# Patient Record
Sex: Female | Born: 1980 | Race: White | Hispanic: No | Marital: Single | Smoking: Current every day smoker
Health system: Southern US, Community
[De-identification: ages and names within clinical notes are randomized; demographics above are authoritative.]

## PROBLEM LIST (undated history)

## (undated) DIAGNOSIS — F1910 Other psychoactive substance abuse, uncomplicated: Secondary | ICD-10-CM

## (undated) DIAGNOSIS — F419 Anxiety disorder, unspecified: Secondary | ICD-10-CM

## (undated) DIAGNOSIS — F909 Attention-deficit hyperactivity disorder, unspecified type: Secondary | ICD-10-CM

## (undated) DIAGNOSIS — Z789 Other specified health status: Secondary | ICD-10-CM

## (undated) HISTORY — PX: FRACTURE SURGERY: SHX138

## (undated) HISTORY — DX: Other psychoactive substance abuse, uncomplicated: F19.10

---

## 2000-02-18 ENCOUNTER — Other Ambulatory Visit: Admission: RE | Admit: 2000-02-18 | Discharge: 2000-02-18 | Payer: Self-pay | Admitting: Obstetrics and Gynecology

## 2000-02-20 ENCOUNTER — Encounter: Payer: Self-pay | Admitting: Obstetrics and Gynecology

## 2000-02-20 ENCOUNTER — Ambulatory Visit (HOSPITAL_COMMUNITY): Admission: RE | Admit: 2000-02-20 | Discharge: 2000-02-20 | Payer: Self-pay | Admitting: Obstetrics and Gynecology

## 2000-07-16 ENCOUNTER — Inpatient Hospital Stay (HOSPITAL_COMMUNITY): Admission: AD | Admit: 2000-07-16 | Discharge: 2000-07-16 | Payer: Self-pay | Admitting: Obstetrics and Gynecology

## 2000-07-16 ENCOUNTER — Ambulatory Visit (HOSPITAL_COMMUNITY): Admission: RE | Admit: 2000-07-16 | Discharge: 2000-07-16 | Payer: Self-pay | Admitting: Internal Medicine

## 2000-07-16 ENCOUNTER — Encounter: Payer: Self-pay | Admitting: Internal Medicine

## 2000-10-25 ENCOUNTER — Encounter: Payer: Self-pay | Admitting: Obstetrics and Gynecology

## 2000-10-25 ENCOUNTER — Ambulatory Visit (HOSPITAL_COMMUNITY): Admission: RE | Admit: 2000-10-25 | Discharge: 2000-10-25 | Payer: Self-pay | Admitting: Obstetrics and Gynecology

## 2000-11-22 ENCOUNTER — Ambulatory Visit (HOSPITAL_COMMUNITY): Admission: RE | Admit: 2000-11-22 | Discharge: 2000-11-22 | Payer: Self-pay | Admitting: Obstetrics and Gynecology

## 2000-11-22 ENCOUNTER — Encounter: Payer: Self-pay | Admitting: Obstetrics and Gynecology

## 2001-03-21 ENCOUNTER — Ambulatory Visit (HOSPITAL_COMMUNITY): Admission: RE | Admit: 2001-03-21 | Discharge: 2001-03-21 | Payer: Self-pay | Admitting: Obstetrics and Gynecology

## 2001-03-21 ENCOUNTER — Encounter: Payer: Self-pay | Admitting: Obstetrics and Gynecology

## 2001-04-04 ENCOUNTER — Inpatient Hospital Stay (HOSPITAL_COMMUNITY): Admission: AD | Admit: 2001-04-04 | Discharge: 2001-04-04 | Payer: Self-pay | Admitting: Obstetrics and Gynecology

## 2001-04-04 ENCOUNTER — Inpatient Hospital Stay (HOSPITAL_COMMUNITY): Admission: AD | Admit: 2001-04-04 | Discharge: 2001-04-06 | Payer: Self-pay | Admitting: Obstetrics and Gynecology

## 2001-05-19 ENCOUNTER — Other Ambulatory Visit: Admission: RE | Admit: 2001-05-19 | Discharge: 2001-05-19 | Payer: Self-pay | Admitting: Obstetrics and Gynecology

## 2003-04-12 ENCOUNTER — Ambulatory Visit (HOSPITAL_COMMUNITY): Admission: RE | Admit: 2003-04-12 | Discharge: 2003-04-12 | Payer: Self-pay | Admitting: Internal Medicine

## 2003-05-22 ENCOUNTER — Encounter (HOSPITAL_COMMUNITY): Admission: RE | Admit: 2003-05-22 | Discharge: 2003-06-21 | Payer: Self-pay | Admitting: Neurosurgery

## 2003-08-10 ENCOUNTER — Other Ambulatory Visit: Admission: RE | Admit: 2003-08-10 | Discharge: 2003-08-10 | Payer: Self-pay | Admitting: Obstetrics and Gynecology

## 2004-01-25 ENCOUNTER — Inpatient Hospital Stay (HOSPITAL_COMMUNITY): Admission: EM | Admit: 2004-01-25 | Discharge: 2004-01-28 | Payer: Self-pay | Admitting: Obstetrics and Gynecology

## 2005-08-17 ENCOUNTER — Other Ambulatory Visit: Admission: RE | Admit: 2005-08-17 | Discharge: 2005-08-17 | Payer: Self-pay | Admitting: Obstetrics and Gynecology

## 2010-02-09 ENCOUNTER — Encounter: Payer: Self-pay | Admitting: Family Medicine

## 2010-05-31 ENCOUNTER — Emergency Department (HOSPITAL_COMMUNITY)
Admission: EM | Admit: 2010-05-31 | Discharge: 2010-05-31 | Disposition: A | Discharge status: Home / Self Care | Payer: Medicaid Other | Attending: Emergency Medicine | Admitting: Emergency Medicine

## 2010-05-31 ENCOUNTER — Emergency Department (HOSPITAL_COMMUNITY): Payer: Medicaid Other

## 2010-05-31 DIAGNOSIS — X58XXXA Exposure to other specified factors, initial encounter: Secondary | ICD-10-CM | POA: Insufficient documentation

## 2010-05-31 DIAGNOSIS — S62309A Unspecified fracture of unspecified metacarpal bone, initial encounter for closed fracture: Secondary | ICD-10-CM | POA: Insufficient documentation

## 2010-06-04 ENCOUNTER — Ambulatory Visit (INDEPENDENT_AMBULATORY_CARE_PROVIDER_SITE_OTHER): Payer: Medicaid Other | Admitting: Orthopedic Surgery

## 2010-06-04 ENCOUNTER — Other Ambulatory Visit: Payer: Self-pay | Admitting: Orthopedic Surgery

## 2010-06-04 ENCOUNTER — Encounter (HOSPITAL_COMMUNITY): Payer: Medicaid Other

## 2010-06-04 ENCOUNTER — Encounter: Payer: Self-pay | Admitting: Orthopedic Surgery

## 2010-06-04 VITALS — HR 78 | Ht 67.0 in | Wt 185.8 lb

## 2010-06-04 DIAGNOSIS — S62309A Unspecified fracture of unspecified metacarpal bone, initial encounter for closed fracture: Secondary | ICD-10-CM

## 2010-06-04 LAB — HEMOGLOBIN AND HEMATOCRIT, BLOOD
HCT: 42.6 % (ref 36.0–46.0)
Hemoglobin: 14.5 g/dL (ref 12.0–15.0)

## 2010-06-04 LAB — BASIC METABOLIC PANEL
Calcium: 9.8 mg/dL (ref 8.4–10.5)
Creatinine, Ser: 0.86 mg/dL (ref 0.4–1.2)
GFR calc Af Amer: >60 mL/min (ref >60)
GFR calc non Af Amer: >60 mL/min (ref >60)

## 2010-06-04 MED ORDER — HYDROCODONE-ACETAMINOPHEN 7.5-325 MG PO TABS: 1.0000 | ORAL_TABLET | Freq: Four times a day (QID) | ORAL | Status: AC | PRN

## 2010-06-05 ENCOUNTER — Encounter: Payer: Self-pay | Admitting: Orthopedic Surgery

## 2010-06-05 DIAGNOSIS — S62309A Unspecified fracture of unspecified metacarpal bone, initial encounter for closed fracture: Secondary | ICD-10-CM | POA: Insufficient documentation
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
## 2010-06-06 ENCOUNTER — Telehealth: Payer: Self-pay | Admitting: Orthopedic Surgery

## 2010-06-06 ENCOUNTER — Ambulatory Visit (HOSPITAL_COMMUNITY): Payer: Medicaid Other

## 2010-06-06 ENCOUNTER — Ambulatory Visit (HOSPITAL_COMMUNITY)
Admission: RE | Admit: 2010-06-06 | Discharge: 2010-06-06 | Disposition: A | Discharge status: Home / Self Care | Payer: Medicaid Other | Source: Ambulatory Visit | Attending: Orthopedic Surgery | Admitting: Orthopedic Surgery

## 2010-06-06 DIAGNOSIS — S62309A Unspecified fracture of unspecified metacarpal bone, initial encounter for closed fracture: Secondary | ICD-10-CM

## 2010-06-06 DIAGNOSIS — X58XXXA Exposure to other specified factors, initial encounter: Secondary | ICD-10-CM | POA: Insufficient documentation

## 2010-06-06 NOTE — Telephone Encounter (Signed)
As noted, per Medicaid's automated system, no pre-authorization required for out-patient surgery, [facility].

## 2010-06-09 ENCOUNTER — Ambulatory Visit (INDEPENDENT_AMBULATORY_CARE_PROVIDER_SITE_OTHER): Payer: Medicaid Other | Admitting: Orthopedic Surgery

## 2010-06-09 DIAGNOSIS — S62309A Unspecified fracture of unspecified metacarpal bone, initial encounter for closed fracture: Secondary | ICD-10-CM
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
## 2010-06-17 ENCOUNTER — Telehealth: Payer: Self-pay | Admitting: Orthopedic Surgery

## 2010-06-17 NOTE — Telephone Encounter (Signed)
Natalie Paul says the Norco 7.5 every 6 hours is not easing her pain, taking Ibuprofen also.  Has been taking 2 Norco  To get relief.  Asking if you will prescribe something stronger so she will not have to double the 7.5.  She uses [employer]

## 2010-06-17 NOTE — Telephone Encounter (Signed)
Sorry we can not   She can take 1 every 4 hrs as needed

## 2010-06-17 NOTE — Telephone Encounter (Signed)
Advised patient per Dr Mort Sawyers response.

## 2010-06-19 ENCOUNTER — Ambulatory Visit (INDEPENDENT_AMBULATORY_CARE_PROVIDER_SITE_OTHER): Payer: Medicaid Other | Admitting: Orthopedic Surgery

## 2010-06-19 DIAGNOSIS — S62309A Unspecified fracture of unspecified metacarpal bone, initial encounter for closed fracture: Secondary | ICD-10-CM

## 2010-06-19 NOTE — Progress Notes (Signed)
Followup visit. Postoperative  Postop day 10. Surgery date May 21.  Procedure closed reduction, percutaneous pinning,LEFT 5th metacarpal fracture.  X-rays scheduled for today.  Pin sites look clean. Alignment of the finger looks normal. X-ray shows maintenance of fracture fixation and hardware.  Patient will be placed in a new splint for an additional 2 weeks and then we removed the splint.

## 2010-06-19 NOTE — Progress Notes (Signed)
A separate x-ray report.  X-ray LEFT hand.  Fracture LEFT hand.  Post internal fixation.   metacarpal fracture with 4 pins transmetacarpal pinning #5 and #4.  Fracture alignment is improved to near anatomic.  Impression healing 5th metacarpal for

## 2010-06-27 ENCOUNTER — Other Ambulatory Visit: Payer: Self-pay | Admitting: *Deleted

## 2010-06-27 DIAGNOSIS — M79643 Pain in unspecified hand: Secondary | ICD-10-CM

## 2010-06-27 MED ORDER — HYDROCODONE-ACETAMINOPHEN 7.5-325 MG PO TABS: 1.0000 | ORAL_TABLET | ORAL | Status: DC | PRN

## 2010-07-03 ENCOUNTER — Ambulatory Visit: Payer: Medicaid Other | Admitting: Orthopedic Surgery

## 2010-07-03 ENCOUNTER — Ambulatory Visit (INDEPENDENT_AMBULATORY_CARE_PROVIDER_SITE_OTHER): Payer: Medicaid Other | Admitting: Orthopedic Surgery

## 2010-07-03 DIAGNOSIS — S6290XA Unspecified fracture of unspecified wrist and hand, initial encounter for closed fracture: Secondary | ICD-10-CM

## 2010-07-03 DIAGNOSIS — S62309A Unspecified fracture of unspecified metacarpal bone, initial encounter for closed fracture: Secondary | ICD-10-CM

## 2010-07-03 DIAGNOSIS — M79643 Pain in unspecified hand: Secondary | ICD-10-CM

## 2010-07-03 DIAGNOSIS — M79609 Pain in unspecified limb: Secondary | ICD-10-CM

## 2010-07-03 MED ORDER — HYDROCODONE-ACETAMINOPHEN 7.5-325 MG PO TABS: 1.0000 | ORAL_TABLET | ORAL | Status: AC | PRN

## 2010-07-03 NOTE — Progress Notes (Signed)
4 weeks' followup postop visit fractured LEFT hand 5th metacarpal status post pinning.  X-rays reviewed today.  X-rays show fracture alignment is good. Recommend pins to be removed, which was done.  Buddy tape, fingers 4 weeks. Active range of motion. Repeat x-ray in 4 weeks

## 2010-07-03 NOTE — Progress Notes (Signed)
X-ray report.  LEFT hand 3 views.  Reason for x-ray followup. Post pinning, LEFT 5th metacarpal.  4 pins are transfixing the 5th metacarpal to the 4th metacarpal. Fracture alignment is normal.  Impression healing LEFT 5th metacarpal fracture with internal

## 2010-07-21 ENCOUNTER — Other Ambulatory Visit: Payer: Self-pay | Admitting: *Deleted

## 2010-07-21 ENCOUNTER — Telehealth: Payer: Self-pay | Admitting: Orthopedic Surgery

## 2010-07-21 DIAGNOSIS — R52 Pain, unspecified: Secondary | ICD-10-CM

## 2010-07-21 MED ORDER — HYDROCODONE-ACETAMINOPHEN 7.5-325 MG PO TABS: 1.0000 | ORAL_TABLET | Freq: Four times a day (QID) | ORAL | Status: AC | PRN

## 2010-07-21 NOTE — Telephone Encounter (Signed)
Says she is changing her prescriptions from [facility] to [facility] and needs a new prescription for Hydrocodone 7.5/325 called to [facility].  She said she got   last refill 07/13/10 for # 42 at [facility]. Her # [phone]

## 2010-07-21 NOTE — Telephone Encounter (Signed)
I tried to call patient, no voicemail has been set up on her phone, could not leave message, She cannot have refill on the Hydrocodone until 07/24/10, too early

## 2010-07-22 NOTE — Telephone Encounter (Signed)
Tried again to reach patient,voice mail not set up.

## 2010-07-28 ENCOUNTER — Telehealth: Payer: Self-pay | Admitting: Orthopedic Surgery

## 2010-07-28 NOTE — Telephone Encounter (Signed)
Called patient, she went to [facility] to pick up rx, I advised I called into [facility] as requested last week, she said she may have been confused, could not decide which pharmacy she was changing too last week either [facility] or [facility], had been using [facility].

## 2010-07-28 NOTE — Telephone Encounter (Signed)
Natalie Paul left a message for you to call her at [phone]

## 2010-07-29 NOTE — Telephone Encounter (Signed)
chasity called the patient

## 2010-08-05 ENCOUNTER — Ambulatory Visit (INDEPENDENT_AMBULATORY_CARE_PROVIDER_SITE_OTHER): Payer: Medicaid Other | Admitting: Orthopedic Surgery

## 2010-08-05 DIAGNOSIS — S62309A Unspecified fracture of unspecified metacarpal bone, initial encounter for closed fracture: Secondary | ICD-10-CM

## 2010-08-05 MED ORDER — HYDROCODONE-ACETAMINOPHEN 7.5-325 MG PO TABS: 1.0000 | ORAL_TABLET | Freq: Four times a day (QID) | ORAL | Status: AC | PRN

## 2010-08-05 NOTE — Progress Notes (Signed)
Surgery date May 21   Followup.  Status post pertains pinning of the LEFT 5th metacarpal fracture.  Today, she scheduled for, x-ray. She's been on a home exercise program. She is done well with that. She's regained her full range of motion. She has minimal pain at the joint and at the fracture line. There are no signs of swelling or infection.  Her x-rays show fracture healing and alignment is normal.  She is discharged to routine activities as tolerated.  Separate identifiable. X-ray report AP, lateral, and oblique of the LEFT hand. There is a fracture of the 5th metacarpal, which is previously noted to have pins, which has healed in anatomic alignment.  Impression healed metacarpal fracture. #5

## 2010-08-05 NOTE — Patient Instructions (Signed)
Continue exercises to improve ROM   F/u as needed

## 2010-08-05 NOTE — Progress Notes (Signed)
Separate identifiable. X-ray report AP, lateral, and oblique of the LEFT hand. There is a fracture of the 5th metacarpal, which is previously noted to have pins, which has healed in anatomic alignment.  Impression healed metacarpal fracture. #5

## 2010-09-16 ENCOUNTER — Other Ambulatory Visit: Payer: Self-pay | Admitting: *Deleted

## 2010-09-16 NOTE — Telephone Encounter (Signed)
Denied pain medication, is as needed to follow up  If needed make appt

## 2014-02-13 ENCOUNTER — Encounter (HOSPITAL_COMMUNITY): Payer: Self-pay | Admitting: Emergency Medicine

## 2014-02-13 ENCOUNTER — Emergency Department (HOSPITAL_COMMUNITY): Payer: Medicaid Other

## 2014-02-13 ENCOUNTER — Emergency Department (HOSPITAL_COMMUNITY)
Admission: EM | Admit: 2014-02-13 | Discharge: 2014-02-13 | Disposition: A | Discharge status: Home / Self Care | Payer: Medicaid Other | Attending: Emergency Medicine | Admitting: Emergency Medicine

## 2014-02-13 DIAGNOSIS — S6392XA Sprain of unspecified part of left wrist and hand, initial encounter: Secondary | ICD-10-CM | POA: Insufficient documentation

## 2014-02-13 DIAGNOSIS — Y9289 Other specified places as the place of occurrence of the external cause: Secondary | ICD-10-CM | POA: Insufficient documentation

## 2014-02-13 DIAGNOSIS — Y998 Other external cause status: Secondary | ICD-10-CM | POA: Insufficient documentation

## 2014-02-13 DIAGNOSIS — W009XXA Unspecified fall due to ice and snow, initial encounter: Secondary | ICD-10-CM | POA: Insufficient documentation

## 2014-02-13 DIAGNOSIS — Z720 Tobacco use: Secondary | ICD-10-CM | POA: Insufficient documentation

## 2014-02-13 DIAGNOSIS — S63502A Unspecified sprain of left wrist, initial encounter: Secondary | ICD-10-CM

## 2014-02-13 DIAGNOSIS — Y9389 Activity, other specified: Secondary | ICD-10-CM | POA: Insufficient documentation

## 2014-02-13 MED ORDER — HYDROCODONE-ACETAMINOPHEN 5-325 MG PO TABS: 1.0000 | ORAL_TABLET | ORAL | Status: DC | PRN

## 2014-02-13 MED ORDER — IBUPROFEN 800 MG PO TABS
800.0000 mg | ORAL_TABLET | Freq: Once | ORAL | Status: AC
  Administered 2014-02-13: 800 mg via ORAL
  Filled 2014-02-13: qty 1

## 2014-02-13 MED ORDER — IBUPROFEN 600 MG PO TABS: 600.0000 mg | ORAL_TABLET | Freq: Four times a day (QID) | ORAL | Status: DC | PRN

## 2014-02-13 NOTE — Discharge Instructions (Signed)
Joint Sprain A sprain is a tear or stretch in the ligaments that hold a joint together. Severe sprains may need as long as 3-6 weeks of immobilization and/or exercises to heal completely. Sprained joints should be rested and protected. If not, they can become unstable and prone to re-injury. Proper treatment can reduce your pain, shorten the period of disability, and reduce the risk of repeated injuries. TREATMENT   Rest and elevate the injured joint to reduce pain and swelling.  Apply ice packs to the injury for 20-30 minutes every 2-3 hours for the next 2-3 days.  You may add heat 20 minutes several times daily starting on day 3.  Keep the injury wrapped in a compression bandage or splint as long as the joint is painful or as instructed by your caregiver.  Do not use the injured joint until it is completely healed to prevent re-injury and chronic instability. Follow the instructions of your caregiver.  Long-term sprain management may require exercises and/or treatment by a physical therapist. Taping or special braces may help stabilize the joint until it is completely better. SEEK MEDICAL CARE IF:   You develop increased pain or swelling of the joint.  You develop increasing redness and warmth of the joint.  You develop a fever.  It becomes stiff.  Your hand or foot gets cold or numb. Document Released: 02/13/2004 Document Revised: 03/30/2011 Document Reviewed: 01/23/2008 [facility] Patient Information 2015 [employer], [place]. This information is not intended to replace advice given to you by your health care provider. Make sure you discuss any questions you have with your health care provider.
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
## 2014-02-13 NOTE — ED Notes (Signed)
Slipped and fell today Pain lt wrist.

## 2014-02-14 NOTE — ED Provider Notes (Signed)
CSN: [id number]     Arrival date & time 02/13/14  1504 History   First MD Initiated Contact with Patient 02/13/14 1613     Chief Complaint  Patient presents with  . Wrist Pain     (Consider location/radiation/quality/duration/timing/severity/associated sxs/prior Treatment) Patient is a 34 y.o. female presenting with wrist pain. The history is provided by the patient and the spouse.  Wrist Pain This is a new problem. The current episode started today. The problem occurs constantly. The problem has been unchanged. Associated symptoms include arthralgias and joint swelling. Pertinent negatives include no fever, myalgias, numbness or weakness. Exacerbated by: movement. She has tried acetaminophen and NSAIDs for the symptoms. The treatment provided no relief.    Pt who slipped on ice this am, landing on her left outstretched hand.  Persistent pain which radiates into the forearm only with wrist flex/ext.  Denies elbow, upper arm, shoulder pain.  History reviewed. No pertinent past medical history. Past Surgical History  Procedure Laterality Date  . Fracture surgery     Family History  Problem Relation Age of Onset  . Cancer     History  Substance Use Topics  . Smoking status: Current Every Day Smoker -- 1.00 packs/day    Types: Cigarettes  . Smokeless tobacco: Never Used  . Alcohol Use: No     Comment: socially   OB History    Gravida Para Term Preterm AB TAB SAB Ectopic Multiple Living   Review of Systems  Constitutional: Negative for fever.  Musculoskeletal: Positive for joint swelling and arthralgias. Negative for myalgias.  Neurological: Negative for weakness and numbness.      Allergies  Review of patient's allergies indicates no known allergies.  Home Medications   Prior to Admission medications   Medication Sig Start Date End Date Taking? Authorizing Provider  acetaminophen (TYLENOL) 500 MG tablet Take 500 mg by mouth every 6 (six) hours as  needed for mild pain or moderate pain.   Yes Historical Provider, MD  HYDROcodone-acetaminophen (NORCO/VICODIN) 5-325 MG per tablet Take 1 tablet by mouth every 4 (four) hours as needed. 02/13/14   Burgess Amor, PA-C  ibuprofen (ADVIL,MOTRIN) 600 MG tablet Take 1 tablet (600 mg total) by mouth every 6 (six) hours as needed. 02/13/14   Burgess Amor, PA-C   BP 110/71 mmHg  Pulse 76  Temp(Src) 98 F (36.7 C) (Oral)  Resp 18  Ht  (1.702 m)  Wt 160 lb (72.576 kg)  BMI 25.05 kg/m2  SpO2 100%  LMP 02/05/2014 Physical Exam  Constitutional: She appears well-developed and well-nourished.  HENT:  Head: Atraumatic.  Neck: Normal range of motion.  Cardiovascular:  Pulses:      Radial pulses are 2+ on the right side, and 2+ on the left side.  Pulses equal bilaterally.  Less than 3 sec distal cap refill.  Musculoskeletal: She exhibits tenderness.       Left wrist: She exhibits tenderness and swelling. She exhibits no crepitus and no deformity.  No ttp joints of hand/fingers.  Increased wrist pain with flex/ext of fingers, especially 3rd, 4th and 5th fingers.  No scaphoid ttp.  Mild edema noted dorsal distal radius.  Neurological: She is alert. She has normal strength. She displays normal reflexes. No sensory deficit.  Skin: Skin is warm and dry.  Psychiatric: She has a normal mood and affect.    ED Course  Procedures (including critical care time)  Labs Review Labs Reviewed - No data to display  Imaging Review Dg Wrist Complete Left  02/13/2014   CLINICAL DATA:  Fall. Fall this morning. LEFT wrist injury. LEFT wrist pain.  EXAM: LEFT WRIST - COMPLETE 3+ VIEW  COMPARISON:  None.  FINDINGS: Anatomic alignment of the bones of the wrist. The distal radius and ulna appear intact. There is soft tissue swelling over the dorsum of the wrist. Scaphoid bone appears normal. Negative for fracture.  IMPRESSION: Soft tissue swelling of the wrist.  Negative for fracture.   Electronically Signed   By: Andreas NewportGeoffrey   Lamke M.D.   On: 02/13/2014 16:00     EKG Interpretation None      MDM   Final diagnoses:  Wrist sprain, left, initial encounter    RICE, wrist splint applied, hydrocodone, ibuprofen.  Ortho referral given for prn f/u if sx persist beyond the next 7-10 days.    Burgess AmorJulie Joplin Canty, PA-C 02/14/14 1240  Benny LennertJoseph L Zammit, MD 02/14/14 [id number]

## 2015-07-19 ENCOUNTER — Emergency Department (HOSPITAL_COMMUNITY): Payer: No Typology Code available for payment source

## 2015-07-19 ENCOUNTER — Encounter (HOSPITAL_COMMUNITY): Payer: Self-pay | Admitting: Emergency Medicine

## 2015-07-19 ENCOUNTER — Emergency Department (HOSPITAL_COMMUNITY)
Admission: EM | Admit: 2015-07-19 | Discharge: 2015-07-19 | Disposition: A | Discharge status: Home / Self Care | Payer: No Typology Code available for payment source | Attending: Emergency Medicine | Admitting: Emergency Medicine

## 2015-07-19 DIAGNOSIS — S60811A Abrasion of right wrist, initial encounter: Secondary | ICD-10-CM | POA: Insufficient documentation

## 2015-07-19 DIAGNOSIS — F17210 Nicotine dependence, cigarettes, uncomplicated: Secondary | ICD-10-CM | POA: Diagnosis not present

## 2015-07-19 DIAGNOSIS — Y92410 Unspecified street and highway as the place of occurrence of the external cause: Secondary | ICD-10-CM | POA: Insufficient documentation

## 2015-07-19 DIAGNOSIS — S5011XA Contusion of right forearm, initial encounter: Secondary | ICD-10-CM

## 2015-07-19 DIAGNOSIS — S6991XA Unspecified injury of right wrist, hand and finger(s), initial encounter: Secondary | ICD-10-CM | POA: Diagnosis present

## 2015-07-19 DIAGNOSIS — Y939 Activity, unspecified: Secondary | ICD-10-CM | POA: Diagnosis not present

## 2015-07-19 DIAGNOSIS — Y999 Unspecified external cause status: Secondary | ICD-10-CM | POA: Diagnosis not present

## 2015-07-19 MED ORDER — IBUPROFEN 800 MG PO TABS
800.0000 mg | ORAL_TABLET | Freq: Once | ORAL | Status: AC
  Administered 2015-07-19: 800 mg via ORAL
  Filled 2015-07-19: qty 1

## 2015-07-19 MED ORDER — TETANUS-DIPHTH-ACELL PERTUSSIS 5-2.5-18.5 LF-MCG/0.5 IM SUSP
0.5000 mL | Freq: Once | INTRAMUSCULAR | Status: AC
  Administered 2015-07-19: 0.5 mL via INTRAMUSCULAR
  Filled 2015-07-19: qty 0.5

## 2015-07-19 MED ORDER — HYDROCODONE-ACETAMINOPHEN 5-325 MG PO TABS: ORAL_TABLET | ORAL | Status: DC

## 2015-07-19 MED ORDER — CYCLOBENZAPRINE HCL 10 MG PO TABS: 10.0000 mg | ORAL_TABLET | Freq: Three times a day (TID) | ORAL | Status: DC | PRN

## 2015-07-19 MED ORDER — IBUPROFEN 600 MG PO TABS: 600.0000 mg | ORAL_TABLET | Freq: Four times a day (QID) | ORAL | Status: DC | PRN

## 2015-07-19 MED ORDER — HYDROCODONE-ACETAMINOPHEN 5-325 MG PO TABS
1.0000 | ORAL_TABLET | Freq: Once | ORAL | Status: AC
  Administered 2015-07-19: 1 via ORAL
  Filled 2015-07-19: qty 1
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
## 2015-11-05 ENCOUNTER — Emergency Department (HOSPITAL_COMMUNITY)
Admission: EM | Admit: 2015-11-05 | Discharge: 2015-11-05 | Disposition: A | Discharge status: Home / Self Care | Payer: Self-pay | Attending: Emergency Medicine | Admitting: Emergency Medicine

## 2015-11-05 ENCOUNTER — Encounter (HOSPITAL_COMMUNITY): Payer: Self-pay | Admitting: Emergency Medicine

## 2015-11-05 DIAGNOSIS — L0291 Cutaneous abscess, unspecified: Secondary | ICD-10-CM

## 2015-11-05 DIAGNOSIS — L02811 Cutaneous abscess of head [any part, except face]: Secondary | ICD-10-CM | POA: Insufficient documentation

## 2015-11-05 DIAGNOSIS — F17210 Nicotine dependence, cigarettes, uncomplicated: Secondary | ICD-10-CM | POA: Insufficient documentation

## 2015-11-05 DIAGNOSIS — Z79899 Other long term (current) drug therapy: Secondary | ICD-10-CM | POA: Insufficient documentation

## 2015-11-05 DIAGNOSIS — Z791 Long term (current) use of non-steroidal anti-inflammatories (NSAID): Secondary | ICD-10-CM | POA: Insufficient documentation

## 2015-11-05 MED ORDER — SULFAMETHOXAZOLE-TRIMETHOPRIM 800-160 MG PO TABS: 1.0000 | ORAL_TABLET | Freq: Two times a day (BID) | ORAL | 0 refills | Status: AC

## 2015-11-05 MED ORDER — NAPROXEN 500 MG PO TABS: 500.0000 mg | ORAL_TABLET | Freq: Two times a day (BID) | ORAL | 0 refills | Status: DC

## 2015-11-05 MED ORDER — CEFTRIAXONE SODIUM 1 G IJ SOLR
1.0000 g | Freq: Once | INTRAMUSCULAR | Status: AC
  Administered 2015-11-05: 1 g via INTRAMUSCULAR
  Filled 2015-11-05: qty 10

## 2015-11-05 MED ORDER — LIDOCAINE HCL (PF) 1 % IJ SOLN
INTRAMUSCULAR | Status: AC
  Filled 2015-11-05: qty 5

## 2015-11-05 NOTE — ED Triage Notes (Signed)
Pt with abscess to her L forehead that started 3 days ago. Pt states she squeezed the area. Small amount of drainage noted at site.

## 2015-11-05 NOTE — Discharge Instructions (Signed)
Warm wet compresses 2-3 times a day.  Clean the area with mild soap and water.  Return here in 1-2 days for recheck if you develop increasing pain, swelling or redness

## 2015-11-05 NOTE — ED Provider Notes (Signed)
[facility]-EMERGENCY DEPT Provider Note   CSN: [id number] Arrival date & time: 11/05/15  1557     History   Chief Complaint Chief Complaint  Patient presents with  . Abscess    HPI [name] is a 35 y.o. female.  HPI   [name] is a 35 y.o. female who presents to the Emergency Department complaining of redness, swelling and draining abscess to the scalp.  She states that 3 days ago she noticed a "pimple" to her left hairline that she squeezed.  She reports pus drained from the area and now has mild surrounding redness and some swelling that extends to her forehead.  Pain is worse with bending over.  She denies fever, headache, dizziness nausea or vomiting.   History reviewed. No pertinent past medical history.  Patient Active Problem List   Diagnosis Date Noted  . Metacarpal bone fracture 06/05/2010    Past Surgical History:  Procedure Laterality Date  . FRACTURE SURGERY      OB History    Gravida Para Term Preterm AB Living   3 3 3          SAB TAB Ectopic Multiple Live Births                   Home Medications    Prior to Admission medications   Medication Sig Start Date End Date Taking? Authorizing Provider  cyclobenzaprine (FLEXERIL) 10 MG tablet Take 1 tablet (10 mg total) by mouth 3 (three) times daily as needed. 07/19/15   Mateo Overbeck, PA-C  HYDROcodone-acetaminophen (NORCO/VICODIN) 5-325 MG tablet Take one tab po q 4-6 hrs prn pain 07/19/15   Kiran Lapine, PA-C  ibuprofen (ADVIL,MOTRIN) 600 MG tablet Take 1 tablet (600 mg total) by mouth every 6 (six) hours as needed. 07/19/15   Richell Corker, PA-C    Family History Family History  Problem Relation Age of Onset  . Cancer      Social History Social History  Substance Use Topics  . Smoking status: Current Every Day Smoker    Packs/day: 1.00    Types: Cigarettes  . Smokeless tobacco: Never Used  . Alcohol use No     Comment: socially     Allergies   Review of patient's allergies  indicates no known allergies.   Review of Systems Review of Systems  Constitutional: Negative for chills and fever.  Respiratory: Negative for shortness of breath.   Cardiovascular: Negative for chest pain.  Gastrointestinal: Negative for nausea and vomiting.  Musculoskeletal: Negative for arthralgias and joint swelling.  Skin: Positive for color change.       Abscess   Neurological: Negative for dizziness, syncope, numbness and headaches.  Hematological: Negative for adenopathy.  All other systems reviewed and are negative.    Physical Exam Updated Vital Signs BP 145/90 (BP Location: Left Arm)   Pulse 77   Temp 98.2 F (36.8 C) (Oral)   Resp 19   Ht 5\' 7"  (1.702 m)   Wt 81.6 kg   LMP 11/01/2015   SpO2 100%   BMI 28.19 kg/m   Physical Exam  Constitutional: She is oriented to person, place, and time. She appears well-developed and well-nourished. No distress.  HENT:  Head: Normocephalic.    Mouth/Throat: Oropharynx is clear and moist.  Pea sized open lesion with crusting and mild erythema and mild edema that extends to the forehead.  No fluctuance.    Eyes: Conjunctivae and EOM are normal. Pupils are equal, round, and reactive  to light.  Neck: Normal range of motion. Neck supple.  Cardiovascular: Normal rate and regular rhythm.   No murmur heard. Pulmonary/Chest: Effort normal and breath sounds normal. No respiratory distress.  Musculoskeletal: Normal range of motion.  Lymphadenopathy:    She has no cervical adenopathy.  Neurological: She is alert and oriented to person, place, and time. She exhibits normal muscle tone. Coordination normal.  Skin: Skin is warm and dry.  Psychiatric: She has a normal mood and affect.  Nursing note and vitals reviewed.    ED Treatments / Results  Labs (all labs ordered are listed, but only abnormal results are displayed) Labs Reviewed - No data to display  EKG  EKG Interpretation None       Radiology No results  found.  Procedures Procedures (including critical care time)  Medications Ordered in ED Medications  cefTRIAXone (ROCEPHIN) injection 1 g (1 g Intramuscular Given 11/05/15 1700)     Initial Impression / Assessment and Plan / ED Course  I have reviewed the triage vital signs and the nursing notes.  Pertinent labs & imaging results that were available during my care of the patient were reviewed by me and considered in my medical decision making (see chart for details).  Clinical Course    Small early abscess to the scalp with mild surrounding erythema and edema.  No drainable abscess at present.  Pt agrees to compresses and abx.  And agrees to ER recheck in 1-2 days for recheck.    Final Clinical Impressions(s) / ED Diagnoses   Final diagnoses:  Abscess    New Prescriptions New Prescriptions   No medications on file     Pauline Aus, PA-C 11/08/15 1234    Donnetta Hutching, MD 11/12/15 1331

## 2017-01-19 NOTE — L&D Delivery Note (Signed)
Patient: Natalie Paul MRN: [id number]  GBS status: Positive, IAP given Ampicillin and PCN   Patient is a 37 y.o. now G4P4 s/p NSVD at [redacted]w[redacted]d, who was admitted for PROM. SROM 17h 65m prior to delivery with clear fluid.    Delivery Note At 11:12 AM a viable female was delivered via Vaginal, Spontaneous (Presentation: LOA ) partial en caul.  APGAR: 9, 9; weight pending .   Placenta status: spontaneous, intact .  Cord:  3 vessel with the following complications: loose nuchal x1.   Anesthesia:  Epidural  Episiotomy: None Lacerations: 1st degree bilateral labial tears  Suture Repair: N/A  Est. Blood Loss (mL): 150   Head delivered LOA with partial en caul. Shoulder and body delivered in usual fashion. Loose nuchal cord x1, reduced after removal of membranes. Infant with spontaneous cry, placed on mother's abdomen, dried and bulb suctioned. Cord clamped x 2 after 1-minute delay, and cut by family member. Cord blood drawn. Placenta delivered spontaneously with gentle cord traction. Fundus firm with massage and Pitocin. Perineum and vagina inspected and found to have bilateral first degree labial lacerations, which were hemostatic.   Mom to postpartum. NPO for scheduled PP BTL this afternoon. Baby to Couplet care / Skin to Skin.  De Hollingshead 10/28/2017, 11:31 AM  Please schedule this patient for Postpartum visit in: 4 weeks with the following provider: Any provider For C/S patients schedule nurse incision check in weeks 2 weeks: no High risk pregnancy complicated by: elevated T21 risk with normal cfDNA, AMA chronic subutex therapy.  Delivery mode:  SVD Anticipated Birth Control:  BTL done PP PP Procedures needed: none  Schedule Integrated BH visit: no

## 2017-03-31 ENCOUNTER — Encounter: Payer: Self-pay | Admitting: Adult Health

## 2017-03-31 ENCOUNTER — Ambulatory Visit: Payer: Medicaid Other | Admitting: Adult Health

## 2017-03-31 VITALS — BP 114/74 | HR 96 | Ht 67.0 in | Wt 200.0 lb

## 2017-03-31 DIAGNOSIS — N926 Irregular menstruation, unspecified: Secondary | ICD-10-CM

## 2017-03-31 DIAGNOSIS — Z3201 Encounter for pregnancy test, result positive: Secondary | ICD-10-CM | POA: Diagnosis not present

## 2017-03-31 DIAGNOSIS — O09521 Supervision of elderly multigravida, first trimester: Secondary | ICD-10-CM | POA: Insufficient documentation

## 2017-03-31 DIAGNOSIS — Z3490 Encounter for supervision of normal pregnancy, unspecified, unspecified trimester: Secondary | ICD-10-CM | POA: Insufficient documentation

## 2017-03-31 DIAGNOSIS — O3680X0 Pregnancy with inconclusive fetal viability, not applicable or unspecified: Secondary | ICD-10-CM | POA: Insufficient documentation

## 2017-03-31 DIAGNOSIS — F1911 Other psychoactive substance abuse, in remission: Secondary | ICD-10-CM | POA: Insufficient documentation

## 2017-03-31 LAB — POCT URINE PREGNANCY: Preg Test, Ur: POSITIVE — AB

## 2017-03-31 MED ORDER — PRENATAL PLUS 27-1 MG PO TABS: 1.0000 | ORAL_TABLET | Freq: Every day | ORAL | 12 refills | Status: DC

## 2017-03-31 MED ORDER — PRENATAL PLUS 27-1 MG PO TABS: 1.0000 | ORAL_TABLET | Freq: Every day | ORAL | 0 refills | Status: DC

## 2017-03-31 NOTE — Patient Instructions (Signed)
First Trimester of Pregnancy The first trimester of pregnancy is from week 1 until the end of week 13 (months 1 through 3). A week after a sperm fertilizes an egg, the egg will implant on the wall of the uterus. This embryo will begin to develop into a baby. Genes from you and your partner will form the baby. The female genes will determine whether the baby will be a boy or a girl. At 6-8 weeks, the eyes and face will be formed, and the heartbeat can be seen on ultrasound. At the end of 12 weeks, all the baby's organs will be formed. Now that you are pregnant, you will want to do everything you can to have a healthy baby. Two of the most important things are to get good prenatal care and to follow your health care provider's instructions. Prenatal care is all the medical care you receive before the baby's birth. This care will help prevent, find, and treat any problems during the pregnancy and childbirth. Body changes during your first trimester Your body goes through many changes during pregnancy. The changes vary from woman to woman.  You may gain or lose a couple of pounds at first.  You may feel sick to your stomach (nauseous) and you may throw up (vomit). If the vomiting is uncontrollable, call your health care provider.  You may tire easily.  You may develop headaches that can be relieved by medicines. All medicines should be approved by your health care provider.  You may urinate more often. Painful urination may mean you have a bladder infection.  You may develop heartburn as a result of your pregnancy.  You may develop constipation because certain hormones are causing the muscles that push stool through your intestines to slow down.  You may develop hemorrhoids or swollen veins (varicose veins).  Your breasts may begin to grow larger and become tender. Your nipples may stick out more, and the tissue that surrounds them (areola) may become darker.  Your gums may bleed and may be  sensitive to brushing and flossing.  Dark spots or blotches (chloasma, mask of pregnancy) may develop on your face. This will likely fade after the baby is born.  Your menstrual periods will stop.  You may have a loss of appetite.  You may develop cravings for certain kinds of food.  You may have changes in your emotions from day to day, such as being excited to be pregnant or being concerned that something may go wrong with the pregnancy and baby.  You may have more vivid and strange dreams.  You may have changes in your hair. These can include thickening of your hair, rapid growth, and changes in texture. Some women also have hair loss during or after pregnancy, or hair that feels dry or thin. Your hair will most likely return to normal after your baby is born.  What to expect at prenatal visits During a routine prenatal visit:  You will be weighed to make sure you and the baby are growing normally.  Your blood pressure will be taken.  Your abdomen will be measured to track your baby's growth.  The fetal heartbeat will be listened to between weeks 10 and 14 of your pregnancy.  Test results from any previous visits will be discussed.  Your health care provider may ask you:  How you are feeling.  If you are feeling the baby move.  If you have had any abnormal symptoms, such as leaking fluid, bleeding, severe headaches,   or abdominal cramping.  If you are using any tobacco products, including cigarettes, chewing tobacco, and electronic cigarettes.  If you have any questions.  Other tests that may be performed during your first trimester include:  Blood tests to find your blood type and to check for the presence of any previous infections. The tests will also be used to check for low iron levels (anemia) and protein on red blood cells (Rh antibodies). Depending on your risk factors, or if you previously had diabetes during pregnancy, you may have tests to check for high blood  sugar that affects pregnant women (gestational diabetes).  Urine tests to check for infections, diabetes, or protein in the urine.  An ultrasound to confirm the proper growth and development of the baby.  Fetal screens for spinal cord problems (spina bifida) and Down syndrome.  HIV (human immunodeficiency virus) testing. Routine prenatal testing includes screening for HIV, unless you choose not to have this test.  You may need other tests to make sure you and the baby are doing well.  Follow these instructions at home: Medicines  Follow your health care provider's instructions regarding medicine use. Specific medicines may be either safe or unsafe to take during pregnancy.  Take a prenatal vitamin that contains at least 600 micrograms (mcg) of folic acid.  If you develop constipation, try taking a stool softener if your health care provider approves. Eating and drinking  Eat a balanced diet that includes fresh fruits and vegetables, whole grains, good sources of protein such as meat, eggs, or tofu, and low-fat dairy. Your health care provider will help you determine the amount of weight gain that is right for you.  Avoid raw meat and uncooked cheese. These carry germs that can cause birth defects in the baby.  Eating four or five small meals rather than three large meals a day may help relieve nausea and vomiting. If you start to feel nauseous, eating a few soda crackers can be helpful. Drinking liquids between meals, instead of during meals, also seems to help ease nausea and vomiting.  Limit foods that are high in fat and processed sugars, such as fried and sweet foods.  To prevent constipation: ? Eat foods that are high in fiber, such as fresh fruits and vegetables, whole grains, and beans. ? Drink enough fluid to keep your urine clear or pale yellow. Activity  Exercise only as directed by your health care provider. Most women can continue their usual exercise routine during  pregnancy. Try to exercise for 30 minutes at least 5 days a week. Exercising will help you: ? Control your weight. ? Stay in shape. ? Be prepared for labor and delivery.  Experiencing pain or cramping in the lower abdomen or lower back is a good sign that you should stop exercising. Check with your health care provider before continuing with normal exercises.  Try to avoid standing for long periods of time. Move your legs often if you must stand in one place for a long time.  Avoid heavy lifting.  Wear low-heeled shoes and practice good posture.  You may continue to have sex unless your health care provider tells you not to. Relieving pain and discomfort  Wear a good support bra to relieve breast tenderness.  Take warm sitz baths to soothe any pain or discomfort caused by hemorrhoids. Use hemorrhoid cream if your health care provider approves.  Rest with your legs elevated if you have leg cramps or low back pain.  If you develop   varicose veins in your legs, wear support hose. Elevate your feet for 15 minutes, 3-4 times a day. Limit salt in your diet. Prenatal care  Schedule your prenatal visits by the twelfth week of pregnancy. They are usually scheduled monthly at first, then more often in the last 2 months before delivery.  Write down your questions. Take them to your prenatal visits.  Keep all your prenatal visits as told by your health care provider. This is important. Safety  Wear your seat belt at all times when driving.  Make a list of emergency phone numbers, including numbers for family, friends, the hospital, and police and fire departments. General instructions  Ask your health care provider for a referral to a local prenatal education class. Begin classes no later than the beginning of month 6 of your pregnancy.  Ask for help if you have counseling or nutritional needs during pregnancy. Your health care provider can offer advice or refer you to specialists for help  with various needs.  Do not use hot tubs, steam rooms, or saunas.  Do not douche or use tampons or scented sanitary pads.  Do not cross your legs for long periods of time.  Avoid cat litter boxes and soil used by cats. These carry germs that can cause birth defects in the baby and possibly loss of the fetus by miscarriage or stillbirth.  Avoid all smoking, herbs, alcohol, and medicines not prescribed by your health care provider. Chemicals in these products affect the formation and growth of the baby.  Do not use any products that contain nicotine or tobacco, such as cigarettes and e-cigarettes. If you need help quitting, ask your health care provider. You may receive counseling support and other resources to help you quit.  Schedule a dentist appointment. At home, brush your teeth with a soft toothbrush and be gentle when you floss. Contact a health care provider if:  You have dizziness.  You have mild pelvic cramps, pelvic pressure, or nagging pain in the abdominal area.  You have persistent nausea, vomiting, or diarrhea.  You have a bad smelling vaginal discharge.  You have pain when you urinate.  You notice increased swelling in your face, hands, legs, or ankles.  You are exposed to fifth disease or chickenpox.  You are exposed to German measles (rubella) and have never had it. Get help right away if:  You have a fever.  You are leaking fluid from your vagina.  You have spotting or bleeding from your vagina.  You have severe abdominal cramping or pain.  You have rapid weight gain or loss.  You vomit blood or material that looks like coffee grounds.  You develop a severe headache.  You have shortness of breath.  You have any kind of trauma, such as from a fall or a car accident. Summary  The first trimester of pregnancy is from week 1 until the end of week 13 (months 1 through 3).  Your body goes through many changes during pregnancy. The changes vary from  woman to woman.  You will have routine prenatal visits. During those visits, your health care provider will examine you, discuss any test results you may have, and talk with you about how you are feeling. This information is not intended to replace advice given to you by your health care provider. Make sure you discuss any questions you have with your health care provider. Document Released: 12/30/2000 Document Revised: 12/18/2015 Document Reviewed: 12/18/2015 [employer] Interactive Patient Education  2018 [employer]  

## 2017-03-31 NOTE — Progress Notes (Signed)
Subjective:     Patient ID: Natalie Paul, female   DOB: 08/16/1980, 37 y.o.   MRN: [id number]  HPI Natalie Paul is a 37 year old white female in for UPT, has missed 2 periods.She used to abuse narcotics but stopped and has been buying suboxone off the street for about a year now, she did not have insurance, has medicaid now.   Review of Systems +missed periods   Reviewed past medical,surgical, social and family history. Reviewed medications and allergies.     Objective:   Physical Exam BP 114/74 (BP Location: Left Arm, Patient Position: Sitting, Cuff Size: Normal)   Pulse 96   Ht 5\' 7"  (1.702 m)   Wt 200 lb (90.7 kg)   LMP 01/21/2017   BMI 31.32 kg/m UPT +, about 9+6 weeks by LMP with EDD 10/28/17.Skin warm and dry. Neck: mid line trachea, normal thyroid, good ROM, no lymphadenopathy noted. Lungs: clear to ausculation bilaterally. Cardiovascular: regular rate and rhythm.Abdomen is soft and non tender.     Assessment:     1. Pregnancy test positive   2. Encounter to determine fetal viability of pregnancy, single or unspecified fetus   3. Pregnancy, unspecified gestational age   544. Elderly multigravida in first trimester   5. History of substance abuse       Plan:     Rx prenatal plus #30 take 1 daily with 12 refills Return in 1 week for dating US Review handouts on First trimester and by [facility]  Given number for [facility] about getting suboxone there.has been buying off the street Decrease smoking

## 2017-04-12 ENCOUNTER — Ambulatory Visit (INDEPENDENT_AMBULATORY_CARE_PROVIDER_SITE_OTHER): Payer: Medicaid Other

## 2017-04-12 ENCOUNTER — Other Ambulatory Visit: Payer: Medicaid Other

## 2017-04-12 ENCOUNTER — Other Ambulatory Visit: Payer: Self-pay | Admitting: Adult Health

## 2017-04-12 DIAGNOSIS — Z3A11 11 weeks gestation of pregnancy: Secondary | ICD-10-CM

## 2017-04-12 DIAGNOSIS — Z3682 Encounter for antenatal screening for nuchal translucency: Secondary | ICD-10-CM

## 2017-04-12 DIAGNOSIS — O3680X0 Pregnancy with inconclusive fetal viability, not applicable or unspecified: Secondary | ICD-10-CM

## 2017-04-12 NOTE — Progress Notes (Signed)
US 11+4 wks,single IUP,CRL 59.56 mm,NB present,NT 2.9 mm,fhr 153 bpm,normal ovaries bilat,anterior pl gr 0,EDD 10/28/2017 by LMP

## 2017-04-14 LAB — INTEGRATED 1
CROWN RUMP LENGTH MAT SCREEN: 59.9 mm
GEST. AGE ON COLLECTION DATE: 12.3 wk
Maternal Age at EDD: 39.3 yr
Nuchal Translucency (NT): 2.9 mm
Number of Fetuses: 1
PAPP-A VALUE: 1028.3 ng/mL
WEIGHT: 195 [lb_av]

## 2017-05-04 ENCOUNTER — Ambulatory Visit (INDEPENDENT_AMBULATORY_CARE_PROVIDER_SITE_OTHER): Payer: Medicaid Other | Admitting: Women's Health

## 2017-05-04 ENCOUNTER — Ambulatory Visit: Payer: Medicaid Other | Admitting: *Deleted

## 2017-05-04 ENCOUNTER — Encounter: Payer: Self-pay | Admitting: Women's Health

## 2017-05-04 ENCOUNTER — Other Ambulatory Visit (HOSPITAL_COMMUNITY)
Admission: RE | Admit: 2017-05-04 | Discharge: 2017-05-04 | Disposition: A | Discharge status: Home / Self Care | Payer: Medicaid Other | Source: Ambulatory Visit | Attending: Obstetrics & Gynecology | Admitting: Obstetrics & Gynecology

## 2017-05-04 VITALS — BP 112/82 | HR 60 | Wt 196.0 lb

## 2017-05-04 DIAGNOSIS — Z363 Encounter for antenatal screening for malformations: Secondary | ICD-10-CM

## 2017-05-04 DIAGNOSIS — O99320 Drug use complicating pregnancy, unspecified trimester: Secondary | ICD-10-CM

## 2017-05-04 DIAGNOSIS — O99332 Smoking (tobacco) complicating pregnancy, second trimester: Secondary | ICD-10-CM | POA: Insufficient documentation

## 2017-05-04 DIAGNOSIS — Z3490 Encounter for supervision of normal pregnancy, unspecified, unspecified trimester: Secondary | ICD-10-CM | POA: Insufficient documentation

## 2017-05-04 DIAGNOSIS — O99322 Drug use complicating pregnancy, second trimester: Secondary | ICD-10-CM | POA: Diagnosis not present

## 2017-05-04 DIAGNOSIS — O09522 Supervision of elderly multigravida, second trimester: Secondary | ICD-10-CM | POA: Diagnosis not present

## 2017-05-04 DIAGNOSIS — Z1389 Encounter for screening for other disorder: Secondary | ICD-10-CM

## 2017-05-04 DIAGNOSIS — F1120 Opioid dependence, uncomplicated: Secondary | ICD-10-CM | POA: Insufficient documentation

## 2017-05-04 DIAGNOSIS — F17210 Nicotine dependence, cigarettes, uncomplicated: Secondary | ICD-10-CM | POA: Diagnosis not present

## 2017-05-04 DIAGNOSIS — Z3482 Encounter for supervision of other normal pregnancy, second trimester: Secondary | ICD-10-CM

## 2017-05-04 DIAGNOSIS — Z3A14 14 weeks gestation of pregnancy: Secondary | ICD-10-CM

## 2017-05-04 DIAGNOSIS — Z331 Pregnant state, incidental: Secondary | ICD-10-CM

## 2017-05-04 DIAGNOSIS — F17200 Nicotine dependence, unspecified, uncomplicated: Secondary | ICD-10-CM | POA: Insufficient documentation

## 2017-05-04 DIAGNOSIS — Z1379 Encounter for other screening for genetic and chromosomal anomalies: Secondary | ICD-10-CM

## 2017-05-04 LAB — POCT URINALYSIS DIPSTICK
Blood, UA: NEGATIVE
Glucose, UA: NEGATIVE
KETONES UA: NEGATIVE
Leukocytes, UA: NEGATIVE
Nitrite, UA: NEGATIVE
Protein, UA: NEGATIVE

## 2017-05-04 NOTE — Patient Instructions (Signed)
Natalie Paul, I greatly value your feedback.  If you receive a survey following your visit with us today, we appreciate you taking the time to fill it out.  Thanks, Joellyn HaffKim Margel Joens, CNM, WHNP-BC   Second Trimester of Pregnancy The second trimester is from week 14 through week 27 (months 4 through 6). The second trimester is often a time when you feel your best. Your body has adjusted to being pregnant, and you begin to feel better physically. Usually, morning sickness has lessened or quit completely, you may have more energy, and you may have an increase in appetite. The second trimester is also a time when the fetus is growing rapidly. At the end of the sixth month, the fetus is about 9 inches long and weighs about 1 pounds. You will likely begin to feel the baby move (quickening) between 16 and 20 weeks of pregnancy. Body changes during your second trimester Your body continues to go through many changes during your second trimester. The changes vary from woman to woman.  Your weight will continue to increase. You will notice your lower abdomen bulging out.  You may begin to get stretch marks on your hips, abdomen, and breasts.  You may develop headaches that can be relieved by medicines. The medicines should be approved by your health care provider.  You may urinate more often because the fetus is pressing on your bladder.  You may develop or continue to have heartburn as a result of your pregnancy.  You may develop constipation because certain hormones are causing the muscles that push waste through your intestines to slow down.  You may develop hemorrhoids or swollen, bulging veins (varicose veins).  You may have back pain. This is caused by: ? Weight gain. ? Pregnancy hormones that are relaxing the joints in your pelvis. ? A shift in weight and the muscles that support your balance.  Your breasts will continue to grow and they will continue to become tender.  Your gums may bleed and  may be sensitive to brushing and flossing.  Dark spots or blotches (chloasma, mask of pregnancy) may develop on your face. This will likely fade after the baby is born.  A dark line from your belly button to the pubic area (linea nigra) may appear. This will likely fade after the baby is born.  You may have changes in your hair. These can include thickening of your hair, rapid growth, and changes in texture. Some women also have hair loss during or after pregnancy, or hair that feels dry or thin. Your hair will most likely return to normal after your baby is born.  What to expect at prenatal visits During a routine prenatal visit:  You will be weighed to make sure you and the fetus are growing normally.  Your blood pressure will be taken.  Your abdomen will be measured to track your baby's growth.  The fetal heartbeat will be listened to.  Any test results from the previous visit will be discussed.  Your health care provider may ask you:  How you are feeling.  If you are feeling the baby move.  If you have had any abnormal symptoms, such as leaking fluid, bleeding, severe headaches, or abdominal cramping.  If you are using any tobacco products, including cigarettes, chewing tobacco, and electronic cigarettes.  If you have any questions.  Other tests that may be performed during your second trimester include:  Blood tests that check for: ? Low iron levels (anemia). ? High  blood sugar that affects pregnant women (gestational diabetes) between 3 and 28 weeks. ? Rh antibodies. This is to check for a protein on red blood cells (Rh factor).  Urine tests to check for infections, diabetes, or protein in the urine.  An ultrasound to confirm the proper growth and development of the baby.  An amniocentesis to check for possible genetic problems.  Fetal screens for spina bifida and Down syndrome.  HIV (human immunodeficiency virus) testing. Routine prenatal testing includes  screening for HIV, unless you choose not to have this test.  Follow these instructions at home: Medicines  Follow your health care provider's instructions regarding medicine use. Specific medicines may be either safe or unsafe to take during pregnancy.  Take a prenatal vitamin that contains at least 600 micrograms (mcg) of folic acid.  If you develop constipation, try taking a stool softener if your health care provider approves. Eating and drinking  Eat a balanced diet that includes fresh fruits and vegetables, whole grains, good sources of protein such as meat, eggs, or tofu, and low-fat dairy. Your health care provider will help you determine the amount of weight gain that is right for you.  Avoid raw meat and uncooked cheese. These carry germs that can cause birth defects in the baby.  If you have low calcium intake from food, talk to your health care provider about whether you should take a daily calcium supplement.  Limit foods that are high in fat and processed sugars, such as fried and sweet foods.  To prevent constipation: ? Drink enough fluid to keep your urine clear or pale yellow. ? Eat foods that are high in fiber, such as fresh fruits and vegetables, whole grains, and beans. Activity  Exercise only as directed by your health care provider. Most women can continue their usual exercise routine during pregnancy. Try to exercise for 30 minutes at least 5 days a week. Stop exercising if you experience uterine contractions.  Avoid heavy lifting, wear low heel shoes, and practice good posture.  A sexual relationship may be continued unless your health care provider directs you otherwise. Relieving pain and discomfort  Wear a good support bra to prevent discomfort from breast tenderness.  Take warm sitz baths to soothe any pain or discomfort caused by hemorrhoids. Use hemorrhoid cream if your health care provider approves.  Rest with your legs elevated if you have leg cramps  or low back pain.  If you develop varicose veins, wear support hose. Elevate your feet for 15 minutes, 3-4 times a day. Limit salt in your diet. Prenatal Care  Write down your questions. Take them to your prenatal visits.  Keep all your prenatal visits as told by your health care provider. This is important. Safety  Wear your seat belt at all times when driving.  Make a list of emergency phone numbers, including numbers for family, friends, the hospital, and police and fire departments. General instructions  Ask your health care provider for a referral to a local prenatal education class. Begin classes no later than the beginning of month 6 of your pregnancy.  Ask for help if you have counseling or nutritional needs during pregnancy. Your health care provider can offer advice or refer you to specialists for help with various needs.  Do not use hot tubs, steam rooms, or saunas.  Do not douche or use tampons or scented sanitary pads.  Do not cross your legs for long periods of time.  Avoid cat litter boxes and soil  used by cats. These carry germs that can cause birth defects in the baby and possibly loss of the fetus by miscarriage or stillbirth.  Avoid all smoking, herbs, alcohol, and unprescribed drugs. Chemicals in these products can affect the formation and growth of the baby.  Do not use any products that contain nicotine or tobacco, such as cigarettes and e-cigarettes. If you need help quitting, ask your health care provider.  Visit your dentist if you have not gone yet during your pregnancy. Use a soft toothbrush to brush your teeth and be gentle when you floss. Contact a health care provider if:  You have dizziness.  You have mild pelvic cramps, pelvic pressure, or nagging pain in the abdominal area.  You have persistent nausea, vomiting, or diarrhea.  You have a bad smelling vaginal discharge.  You have pain when you urinate. Get help right away if:  You have a  fever.  You are leaking fluid from your vagina.  You have spotting or bleeding from your vagina.  You have severe abdominal cramping or pain.  You have rapid weight gain or weight loss.  You have shortness of breath with chest pain.  You notice sudden or extreme swelling of your face, hands, ankles, feet, or legs.  You have not felt your baby move in over an hour.  You have severe headaches that do not go away when you take medicine.  You have vision changes. Summary  The second trimester is from week 14 through week 27 (months 4 through 6). It is also a time when the fetus is growing rapidly.  Your body goes through many changes during pregnancy. The changes vary from woman to woman.  Avoid all smoking, herbs, alcohol, and unprescribed drugs. These chemicals affect the formation and growth your baby.  Do not use any tobacco products, such as cigarettes, chewing tobacco, and e-cigarettes. If you need help quitting, ask your health care provider.  Contact your health care provider if you have any questions. Keep all prenatal visits as told by your health care provider. This is important. This information is not intended to replace advice given to you by your health care provider. Make sure you discuss any questions you have with your health care provider. Document Released: 12/30/2000 Document Revised: 06/13/2015 Document Reviewed: 03/08/2012 Elsevier Interactive Patient Education  2017 [employer].

## 2017-05-04 NOTE — Progress Notes (Signed)
INITIAL OBSTETRICAL VISIT Patient name: Natalie Paul MRN [id number]  Date of birth: 01/02/1981 Chief Complaint:   Initial Prenatal Visit  History of Present Illness:   Natalie Paul is a 37 y.o. [id number] Caucasian female at 5158w5d by LMP c/w 12wk u/s, with an Estimated Date of Delivery: 10/28/17 being seen today for her initial obstetrical visit.   Her obstetrical history is significant for term uncomplicated SVB x 3.  H/O [age]yr narcotic addiction, lost insurance, started buying meds from street, about 1 year ago she decided to stop narcotics and switch to suboxone herself, getting off street b/c still didn't have insurance. She just recently got insurance, and now has gotten in with [place] and is on Subutex 8mg  BID, goes weekly. Smokes 3/4-1ppd, down from 1.5ppd. Interested in quitting.  Today she reports no complaints.  Patient's last menstrual period was 01/21/2017. Last pap years ago. Results were: normal Review of Systems:   Pertinent items are noted in HPI Denies cramping/contractions, leakage of fluid, vaginal bleeding, abnormal vaginal discharge w/ itching/odor/irritation, headaches, visual changes, shortness of breath, chest pain, abdominal pain, severe nausea/vomiting, or problems with urination or bowel movements unless otherwise stated above.  Pertinent History Reviewed:  Reviewed past medical,surgical, social, obstetrical and family history.  Reviewed problem list, medications and allergies. OB History  Gravida Para Term Preterm AB Living  4 3 3     3   SAB TAB Ectopic Multiple Live Births          3    # Outcome Date GA Lbr Len/2nd Weight Sex Delivery Anes PTL Lv  4 Current           3 Term 10/25/07 6676w0d  5 lb 7 oz (2.466 kg) F Vag-Spont None N LIV  2 Term 01/26/04 2844w0d  6 lb 9 oz (2.977 kg) F Vag-Spont EPI N LIV  1 Term 04/05/01 6677w0d  7 lb 10 oz (3.459 kg) F Vag-Spont EPI N LIV   Physical Assessment:   Vitals:   05/04/17 0915  BP: 112/82  Pulse: 60    Weight: 196 lb (88.9 kg)  Body mass index is 30.7 kg/m.       Physical Examination:  General appearance - well appearing, and in no distress  Mental status - alert, oriented to person, place, and time  Psych:  She has a normal mood and affect  Skin - warm and dry, normal color, no suspicious lesions noted  Chest - effort normal, all lung fields clear to auscultation bilaterally  Heart - normal rate and regular rhythm  Abdomen - soft, nontender  Extremities:  No swelling or varicosities noted  Pelvic - VULVA: normal appearing vulva with no masses, tenderness or lesions  VAGINA: normal appearing vagina with normal color and discharge, no lesions  CERVIX: normal appearing cervix without discharge or lesions, no CMT  Thin prep pap is done w/ HR HPV cotesting  Fetal Heart Rate (bpm): 140 via doppler  Results for orders placed or performed in visit on 05/04/17 (from the past 24 hour(s))  POCT urinalysis dipstick   Collection Time: 05/04/17  9:41 AM  Result Value Ref Range   Color, UA     Clarity, UA     Glucose, UA neg    Bilirubin, UA     Ketones, UA neg    Spec Grav, UA  1.010 - 1.025   Blood, UA neg    pH, UA  5.0 - 8.0   Protein, UA neg  Urobilinogen, UA  0.2 or 1.0 E.U./dL   Nitrite, UA neg    Leukocytes, UA Negative Negative   Appearance     Odor      Assessment & Plan:  1) Low-Risk Pregnancy G4P3003 at [redacted]w[redacted]d with an Estimated Date of Delivery: 10/28/17   2) Initial OB visit  3) AMA- 36yo  4) Subutex therapy> 8mg  BID by Dr. Cathey Endow, discussed NAS/NICU tour  5) Smoker> Smokes 3/4-1pp/day, counseled x 3-50mins, advised cessation, discussed risks to fetus while pregnant, to infant pp, and to herself. Offered QuitlineNC, accepted, referral sent.     Meds: No orders of the defined types were placed in this encounter.   Initial labs obtained Continue prenatal vitamins Reviewed n/v relief measures and warning s/s to report Reviewed recommended weight gain based on  pre-gravid BMI Encouraged well-balanced diet Genetic Screening discussed Integrated Screen: doing 2nd IT today Cystic fibrosis screening discussed declined Ultrasound discussed; fetal survey: requested CCNC completed>spoke w/ PCM  Follow-up: Return in about 1 month (around 06/01/2017) for LROB, [id number].   Orders Placed This Encounter  Procedures  . Urine Culture  . US OB Comp + 14 Wk  . Obstetric Panel, Including HIV  . Urinalysis, Routine w reflex microscopic  . INTEGRATED 2  . Pain Management Screening Profile (10S)  . POCT urinalysis dipstick    Cheral Marker CNM, [facility] 05/04/2017 2:10 PM

## 2017-05-05 ENCOUNTER — Encounter: Payer: Self-pay | Admitting: Women's Health

## 2017-05-05 DIAGNOSIS — F1290 Cannabis use, unspecified, uncomplicated: Secondary | ICD-10-CM | POA: Insufficient documentation

## 2017-05-05 LAB — PMP SCREEN PROFILE (10S), URINE
Amphetamine Scrn, Ur: NEGATIVE ng/mL
BARBITURATE SCREEN URINE: NEGATIVE ng/mL
BENZODIAZEPINE SCREEN, URINE: NEGATIVE ng/mL
CANNABINOIDS UR QL SCN: POSITIVE ng/mL — AB
Cocaine (Metab) Scrn, Ur: NEGATIVE ng/mL
Creatinine(Crt), U: 97.5 mg/dL (ref 20.0–300.0)
Methadone Screen, Urine: NEGATIVE ng/mL
OXYCODONE+OXYMORPHONE UR QL SCN: NEGATIVE ng/mL
Opiate Scrn, Ur: NEGATIVE ng/mL
Ph of Urine: 6.1 (ref 4.5–8.9)
Phencyclidine Qn, Ur: NEGATIVE ng/mL
Propoxyphene Scrn, Ur: NEGATIVE ng/mL

## 2017-05-06 ENCOUNTER — Encounter: Payer: Self-pay | Admitting: Women's Health

## 2017-05-06 DIAGNOSIS — O285 Abnormal chromosomal and genetic finding on antenatal screening of mother: Secondary | ICD-10-CM | POA: Insufficient documentation

## 2017-05-06 LAB — INTEGRATED 2
AFP MoM: 0.73
Alpha-Fetoprotein: 16.7 ng/mL
CROWN RUMP LENGTH: 59.9 mm
DIA MOM: 1.63
DIA VALUE: 247 pg/mL
Estriol, Unconjugated: 0.88 ng/mL
GESTATIONAL AGE: 15.4 wk
Gest. Age on Collection Date: 12.3 weeks
Maternal Age at EDD: 39.3 yr
NUCHAL TRANSLUCENCY MOM: 2.17
Nuchal Translucency (NT): 2.9 mm
Number of Fetuses: 1
PAPP-A MOM: 1.53
PAPP-A VALUE: 1028.3 ng/mL
TEST RESULTS: POSITIVE — AB
WEIGHT: 195 [lb_av]
Weight: 195 [lb_av]
hCG MoM: 0.8
hCG Value: 27.2 IU/mL
uE3 MoM: 1.36

## 2017-05-06 LAB — OBSTETRIC PANEL, INCLUDING HIV
Antibody Screen: NEGATIVE
Basophils Absolute: 0 10*3/uL (ref 0.0–0.2)
Basos: 0 %
EOS (ABSOLUTE): 0.2 10*3/uL (ref 0.0–0.4)
Eos: 2 %
HEMATOCRIT: 46.7 % — AB (ref 34.0–46.6)
HEMOGLOBIN: 15.7 g/dL (ref 11.1–15.9)
HEP B S AG: NEGATIVE
HIV SCREEN 4TH GENERATION: NONREACTIVE
Immature Grans (Abs): 0 10*3/uL (ref 0.0–0.1)
Immature Granulocytes: 0 %
LYMPHS: 16 %
Lymphocytes Absolute: 1.9 10*3/uL (ref 0.7–3.1)
MCH: 31.5 pg (ref 26.6–33.0)
MCHC: 33.6 g/dL (ref 31.5–35.7)
MCV: 94 fL (ref 79–97)
Monocytes Absolute: 1 10*3/uL — ABNORMAL HIGH (ref 0.1–0.9)
Monocytes: 9 %
NEUTROS ABS: 8.8 10*3/uL — AB (ref 1.4–7.0)
Neutrophils: 73 %
Platelets: 211 10*3/uL (ref 150–379)
RBC: 4.98 x10E6/uL (ref 3.77–5.28)
RDW: 15.1 % (ref 12.3–15.4)
RPR: NONREACTIVE
Rh Factor: POSITIVE
Rubella Antibodies, IGG: 1.34 index (ref >0.99)
WBC: 12 10*3/uL — ABNORMAL HIGH (ref 3.4–10.8)

## 2017-05-06 LAB — URINALYSIS, ROUTINE W REFLEX MICROSCOPIC
Bilirubin, UA: NEGATIVE
Glucose, UA: NEGATIVE
KETONES UA: NEGATIVE
LEUKOCYTES UA: NEGATIVE
NITRITE UA: NEGATIVE
Protein, UA: NEGATIVE
RBC UA: NEGATIVE
SPEC GRAV UA: 1.017 (ref 1.005–1.030)
Urobilinogen, Ur: 0.2 mg/dL (ref 0.2–1.0)
pH, UA: 5.5 (ref 5.0–7.5)

## 2017-05-06 LAB — CYTOLOGY - PAP
Chlamydia: NEGATIVE
Diagnosis: NEGATIVE
HPV (WINDOPATH): NOT DETECTED
Neisseria Gonorrhea: NEGATIVE

## 2017-05-06 LAB — URINE CULTURE

## 2017-05-10 ENCOUNTER — Telehealth: Payer: Self-pay | Admitting: Women's Health

## 2017-05-10 ENCOUNTER — Other Ambulatory Visit: Payer: Medicaid Other

## 2017-05-10 DIAGNOSIS — O09522 Supervision of elderly multigravida, second trimester: Secondary | ICD-10-CM

## 2017-05-10 DIAGNOSIS — O285 Abnormal chromosomal and genetic finding on antenatal screening of mother: Secondary | ICD-10-CM

## 2017-05-10 NOTE — Telephone Encounter (Signed)
LM for pt to return call. Need to notify of abnormal genetic screening.  Cheral MarkerKimberly R. Treesa Mccully, CNM, [facility] 05/10/2017 10:23 AM

## 2017-05-10 NOTE — Telephone Encounter (Signed)
Called pt, notified of increased r/f T21 on nt/it. Discussed this is not diagnostic, only screening. Offered cfDNA, genetic counseling and detailed u/s w/ MFM- pt wants all. Ordered MaterniT21-pt understands this is also not diagnostic, scheduled genetic referral and detailed u/s w/ MFM 5/13 @ 0930.

## 2017-05-10 NOTE — Telephone Encounter (Signed)
Patient called stating that she was returning Kim's calls. Please contact pt

## 2017-05-14 LAB — MATERNIT 21 PLUS CORE, BLOOD
CHROMOSOME 13: NEGATIVE
Chromosome 18: NEGATIVE
Chromosome 21: NEGATIVE
Y Chromosome: DETECTED

## 2017-05-17 ENCOUNTER — Telehealth: Payer: Self-pay | Admitting: Women's Health

## 2017-05-17 DIAGNOSIS — Z3482 Encounter for supervision of other normal pregnancy, second trimester: Secondary | ICD-10-CM

## 2017-05-17 DIAGNOSIS — O285 Abnormal chromosomal and genetic finding on antenatal screening of mother: Secondary | ICD-10-CM

## 2017-05-17 NOTE — Telephone Encounter (Signed)
Called pt, notified of MaterniT21 results: negative for T21, T18, T13. Understands still not diagnostic. Recommended keeping appt w/ MFM for genetic counseling and detailed u/s. Doesn't want to know gender, wants me to call her mom and tell her, which I did. Understands gender will be on MyChart results of MaterniT21, so do not look.  Cheral Marker, CNM, [facility] 05/17/2017 9:27 AM

## 2017-05-28 ENCOUNTER — Encounter (HOSPITAL_COMMUNITY): Payer: Self-pay

## 2017-05-31 ENCOUNTER — Encounter (HOSPITAL_COMMUNITY): Payer: Self-pay

## 2017-05-31 ENCOUNTER — Ambulatory Visit (HOSPITAL_COMMUNITY): Admission: RE | Admit: 2017-05-31 | Payer: Medicaid Other | Source: Ambulatory Visit

## 2017-05-31 ENCOUNTER — Other Ambulatory Visit (HOSPITAL_COMMUNITY): Payer: Self-pay | Admitting: *Deleted

## 2017-05-31 ENCOUNTER — Ambulatory Visit (HOSPITAL_COMMUNITY)
Admission: RE | Admit: 2017-05-31 | Discharge: 2017-05-31 | Disposition: A | Discharge status: Home / Self Care | Payer: Medicaid Other | Source: Ambulatory Visit | Attending: Women's Health | Admitting: Women's Health

## 2017-05-31 DIAGNOSIS — O09522 Supervision of elderly multigravida, second trimester: Secondary | ICD-10-CM | POA: Insufficient documentation

## 2017-05-31 DIAGNOSIS — Z3A18 18 weeks gestation of pregnancy: Secondary | ICD-10-CM | POA: Insufficient documentation

## 2017-05-31 DIAGNOSIS — O285 Abnormal chromosomal and genetic finding on antenatal screening of mother: Secondary | ICD-10-CM | POA: Diagnosis present

## 2017-05-31 DIAGNOSIS — Z362 Encounter for other antenatal screening follow-up: Secondary | ICD-10-CM

## 2017-06-01 ENCOUNTER — Encounter: Payer: Self-pay | Admitting: Women's Health

## 2017-06-01 ENCOUNTER — Other Ambulatory Visit: Payer: Medicaid Other

## 2017-06-01 ENCOUNTER — Ambulatory Visit (INDEPENDENT_AMBULATORY_CARE_PROVIDER_SITE_OTHER): Payer: Medicaid Other | Admitting: Women's Health

## 2017-06-01 VITALS — BP 98/60 | HR 71 | Wt 196.0 lb

## 2017-06-01 DIAGNOSIS — K5900 Constipation, unspecified: Secondary | ICD-10-CM

## 2017-06-01 DIAGNOSIS — N3946 Mixed incontinence: Secondary | ICD-10-CM

## 2017-06-01 DIAGNOSIS — Z1389 Encounter for screening for other disorder: Secondary | ICD-10-CM

## 2017-06-01 DIAGNOSIS — Z331 Pregnant state, incidental: Secondary | ICD-10-CM

## 2017-06-01 DIAGNOSIS — O9989 Other specified diseases and conditions complicating pregnancy, childbirth and the puerperium: Secondary | ICD-10-CM

## 2017-06-01 DIAGNOSIS — O99612 Diseases of the digestive system complicating pregnancy, second trimester: Secondary | ICD-10-CM

## 2017-06-01 DIAGNOSIS — Z3A18 18 weeks gestation of pregnancy: Secondary | ICD-10-CM

## 2017-06-01 DIAGNOSIS — Z3482 Encounter for supervision of other normal pregnancy, second trimester: Secondary | ICD-10-CM

## 2017-06-01 LAB — POCT URINALYSIS DIPSTICK
Blood, UA: NEGATIVE
Glucose, UA: NEGATIVE
Ketones, UA: NEGATIVE
Nitrite, UA: NEGATIVE

## 2017-06-01 NOTE — Patient Instructions (Addendum)
Gay Filler, I greatly value your feedback.  If you receive a survey following your visit with [place] today, we appreciate you taking the time to fill it out.  Thanks, Joellyn Haff, CNM, WHNP-BC  Constipation  Drink plenty of fluid, preferably water, throughout the day  Eat foods high in fiber such as fruits, vegetables, and grains  Exercise, such as walking, is a good way to keep your bowels regular  Drink warm fluids, especially warm prune juice, or decaf coffee  Eat a 1/2 cup of real oatmeal (not instant), 1/2 cup applesauce, and 1/2-1 cup warm prune juice every day  If needed, you may take Colace (docusate sodium) stool softener once or twice a day to help keep the stool soft. If you are pregnant, wait until you are out of your first trimester (12-14 weeks of pregnancy)  If you still are having problems with constipation, you may take Miralax once daily as needed to help keep your bowels regular.  If you are pregnant, wait until you are out of your first trimester (12-14 weeks of pregnancy)      Second Trimester of Pregnancy The second trimester is from week 14 through week 27 (months 4 through 6). The second trimester is often a time when you feel your best. Your body has adjusted to being pregnant, and you begin to feel better physically. Usually, morning sickness has lessened or quit completely, you may have more energy, and you may have an increase in appetite. The second trimester is also a time when the fetus is growing rapidly. At the end of the sixth month, the fetus is about 9 inches long and weighs about 1 pounds. You will likely begin to feel the baby move (quickening) between 16 and 20 weeks of pregnancy. Body changes during your second trimester Your body continues to go through many changes during your second trimester. The changes vary from woman to woman.  Your weight will continue to increase. You will notice your lower abdomen bulging out.  You may begin to get stretch  marks on your hips, abdomen, and breasts.  You may develop headaches that can be relieved by medicines. The medicines should be approved by your health care provider.  You may urinate more often because the fetus is pressing on your bladder.  You may develop or continue to have heartburn as a result of your pregnancy.  You may develop constipation because certain hormones are causing the muscles that push waste through your intestines to slow down.  You may develop hemorrhoids or swollen, bulging veins (varicose veins).  You may have back pain. This is caused by: ? Weight gain. ? Pregnancy hormones that are relaxing the joints in your pelvis. ? A shift in weight and the muscles that support your balance.  Your breasts will continue to grow and they will continue to become tender.  Your gums may bleed and may be sensitive to brushing and flossing.  Dark spots or blotches (chloasma, mask of pregnancy) may develop on your face. This will likely fade after the baby is born.  A dark line from your belly button to the pubic area (linea nigra) may appear. This will likely fade after the baby is born.  You may have changes in your hair. These can include thickening of your hair, rapid growth, and changes in texture. Some women also have hair loss during or after pregnancy, or hair that feels dry or thin. Your hair will most likely return to normal after  your baby is born.  What to expect at prenatal visits During a routine prenatal visit:  You will be weighed to make sure you and the fetus are growing normally.  Your blood pressure will be taken.  Your abdomen will be measured to track your baby's growth.  The fetal heartbeat will be listened to.  Any test results from the previous visit will be discussed.  Your health care provider may ask you:  How you are feeling.  If you are feeling the baby move.  If you have had any abnormal symptoms, such as leaking fluid, bleeding,  severe headaches, or abdominal cramping.  If you are using any tobacco products, including cigarettes, chewing tobacco, and electronic cigarettes.  If you have any questions.  Other tests that may be performed during your second trimester include:  Blood tests that check for: ? Low iron levels (anemia). ? High blood sugar that affects pregnant women (gestational diabetes) between 29 and 28 weeks. ? Rh antibodies. This is to check for a protein on red blood cells (Rh factor).  Urine tests to check for infections, diabetes, or protein in the urine.  An ultrasound to confirm the proper growth and development of the baby.  An amniocentesis to check for possible genetic problems.  Fetal screens for spina bifida and Down syndrome.  HIV (human immunodeficiency virus) testing. Routine prenatal testing includes screening for HIV, unless you choose not to have this test.  Follow these instructions at home: Medicines  Follow your health care provider's instructions regarding medicine use. Specific medicines may be either safe or unsafe to take during pregnancy.  Take a prenatal vitamin that contains at least 600 micrograms (mcg) of folic acid.  If you develop constipation, try taking a stool softener if your health care provider approves. Eating and drinking  Eat a balanced diet that includes fresh fruits and vegetables, whole grains, good sources of protein such as meat, eggs, or tofu, and low-fat dairy. Your health care provider will help you determine the amount of weight gain that is right for you.  Avoid raw meat and uncooked cheese. These carry germs that can cause birth defects in the baby.  If you have low calcium intake from food, talk to your health care provider about whether you should take a daily calcium supplement.  Limit foods that are high in fat and processed sugars, such as fried and sweet foods.  To prevent constipation: ? Drink enough fluid to keep your urine clear  or pale yellow. ? Eat foods that are high in fiber, such as fresh fruits and vegetables, whole grains, and beans. Activity  Exercise only as directed by your health care provider. Most women can continue their usual exercise routine during pregnancy. Try to exercise for 30 minutes at least 5 days a week. Stop exercising if you experience uterine contractions.  Avoid heavy lifting, wear low heel shoes, and practice good posture.  A sexual relationship may be continued unless your health care provider directs you otherwise. Relieving pain and discomfort  Wear a good support bra to prevent discomfort from breast tenderness.  Take warm sitz baths to soothe any pain or discomfort caused by hemorrhoids. Use hemorrhoid cream if your health care provider approves.  Rest with your legs elevated if you have leg cramps or low back pain.  If you develop varicose veins, wear support hose. Elevate your feet for 15 minutes, 3-4 times a day. Limit salt in your diet. Prenatal Care  Write down your  questions. Take them to your prenatal visits.  Keep all your prenatal visits as told by your health care provider. This is important. Safety  Wear your seat belt at all times when driving.  Make a list of emergency phone numbers, including numbers for family, friends, the hospital, and police and fire departments. General instructions  Ask your health care provider for a referral to a local prenatal education class. Begin classes no later than the beginning of month 6 of your pregnancy.  Ask for help if you have counseling or nutritional needs during pregnancy. Your health care provider can offer advice or refer you to specialists for help with various needs.  Do not use hot tubs, steam rooms, or saunas.  Do not douche or use tampons or scented sanitary pads.  Do not cross your legs for long periods of time.  Avoid cat litter boxes and soil used by cats. These carry germs that can cause birth defects  in the baby and possibly loss of the fetus by miscarriage or stillbirth.  Avoid all smoking, herbs, alcohol, and unprescribed drugs. Chemicals in these products can affect the formation and growth of the baby.  Do not use any products that contain nicotine or tobacco, such as cigarettes and e-cigarettes. If you need help quitting, ask your health care provider.  Visit your dentist if you have not gone yet during your pregnancy. Use a soft toothbrush to brush your teeth and be gentle when you floss. Contact a health care provider if:  You have dizziness.  You have mild pelvic cramps, pelvic pressure, or nagging pain in the abdominal area.  You have persistent nausea, vomiting, or diarrhea.  You have a bad smelling vaginal discharge.  You have pain when you urinate. Get help right away if:  You have a fever.  You are leaking fluid from your vagina.  You have spotting or bleeding from your vagina.  You have severe abdominal cramping or pain.  You have rapid weight gain or weight loss.  You have shortness of breath with chest pain.  You notice sudden or extreme swelling of your face, hands, ankles, feet, or legs.  You have not felt your baby move in over an hour.  You have severe headaches that do not go away when you take medicine.  You have vision changes. Summary  The second trimester is from week 14 through week 27 (months 4 through 6). It is also a time when the fetus is growing rapidly.  Your body goes through many changes during pregnancy. The changes vary from woman to woman.  Avoid all smoking, herbs, alcohol, and unprescribed drugs. These chemicals affect the formation and growth your baby.  Do not use any tobacco products, such as cigarettes, chewing tobacco, and e-cigarettes. If you need help quitting, ask your health care provider.  Contact your health care provider if you have any questions. Keep all prenatal visits as told by your health care provider. This  is important. This information is not intended to replace advice given to you by your health care provider. Make sure you discuss any questions you have with your health care provider. Document Released: 12/30/2000 Document Revised: 06/13/2015 Document Reviewed: 03/08/2012 Elsevier Interactive Patient Education  2017 [employer].

## 2017-06-01 NOTE — Progress Notes (Signed)
   LOW-RISK PREGNANCY VISIT Patient name: Natalie Paul MRN [id number]  Date of birth: 1980/08/03 Chief Complaint:   Routine Prenatal Visit (constipated)  History of Present Illness:   Natalie Paul is a 37 y.o. [id number] female at [redacted]w[redacted]d with an Estimated Date of Delivery: 10/28/17 being seen today for ongoing management of a low-risk pregnancy. Subutex therapy.  Today she reports went to MFM yesterday for detailed u/s d/t increased r/f T21 on nt/it (neg MaterniT21), normal u/s, unable to get some views of heart, so repeat u/s and genetic counselor appt scheduled for 6/10. Some constipation.   . Vag. Bleeding: None.  Movement: Absent. denies leaking of fluid. Review of Systems:   Pertinent items are noted in HPI Denies abnormal vaginal discharge w/ itching/odor/irritation, headaches, visual changes, shortness of breath, chest pain, abdominal pain, severe nausea/vomiting, or problems with urination or bowel movements unless otherwise stated above. Pertinent History Reviewed:  Reviewed past medical,surgical, social, obstetrical and family history.  Reviewed problem list, medications and allergies. Physical Assessment:   Vitals:   06/01/17 1615  BP: 98/60  Pulse: 71  Weight: 196 lb (88.9 kg)  Body mass index is 30.7 kg/m.        Physical Examination:   General appearance: Well appearing, and in no distress  Mental status: Alert, oriented to person, place, and time  Skin: Warm & dry  Cardiovascular: Normal heart rate noted  Respiratory: Normal respiratory effort, no distress  Abdomen: Soft, gravid, nontender  Pelvic: Cervical exam deferred         Extremities: Edema: None  Fetal Status: Fetal Heart Rate (bpm): 145   Movement: Absent    Results for orders placed or performed in visit on 06/01/17 (from the past 24 hour(s))  POCT urinalysis dipstick   Collection Time: 06/01/17  4:16 PM  Result Value Ref Range   Color, UA     Clarity, UA     Glucose, UA neg    Bilirubin, UA     Ketones, UA neg    Spec Grav, UA  1.010 - 1.025   Blood, UA neg    pH, UA  5.0 - 8.0   Protein, UA trace    Urobilinogen, UA  0.2 or 1.0 E.U./dL   Nitrite, UA neg    Leukocytes, UA Trace (A) Negative   Appearance     Odor      Assessment & Plan:  1) Low-risk pregnancy G4P3003 at [redacted]w[redacted]d with an Estimated Date of Delivery: 10/28/17   2) Subutex therapy,  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  Labs/procedures today: none  Plan:  Continue routine obstetrical care   Reviewed: Preterm labor symptoms and general obstetric precautions including but not limited to vaginal bleeding, contractions, leaking of fluid and fetal movement were reviewed in detail with the patient.  All questions were answered  Follow-up: Return in about 1 month (around 06/29/2017) for LROB.  Orders Placed This Encounter  Procedures  . POCT urinalysis dipstick   Cheral Marker CNM, [facility] 06/01/2017 4:36 PM

## 2017-06-28 ENCOUNTER — Encounter (HOSPITAL_COMMUNITY): Payer: Self-pay

## 2017-06-28 ENCOUNTER — Ambulatory Visit (HOSPITAL_COMMUNITY)
Admission: RE | Admit: 2017-06-28 | Discharge: 2017-06-28 | Disposition: A | Discharge status: Home / Self Care | Payer: Medicaid Other | Source: Ambulatory Visit | Attending: Surgery | Admitting: Surgery

## 2017-06-28 ENCOUNTER — Other Ambulatory Visit (HOSPITAL_COMMUNITY): Payer: Self-pay | Admitting: Obstetrics and Gynecology

## 2017-06-28 ENCOUNTER — Ambulatory Visit (HOSPITAL_COMMUNITY)
Admission: RE | Admit: 2017-06-28 | Discharge: 2017-06-28 | Disposition: A | Discharge status: Home / Self Care | Payer: Medicaid Other | Source: Ambulatory Visit | Attending: Women's Health | Admitting: Women's Health

## 2017-06-28 ENCOUNTER — Other Ambulatory Visit (HOSPITAL_COMMUNITY): Payer: Self-pay | Admitting: *Deleted

## 2017-06-28 DIAGNOSIS — O09522 Supervision of elderly multigravida, second trimester: Secondary | ICD-10-CM | POA: Insufficient documentation

## 2017-06-28 DIAGNOSIS — Z3A22 22 weeks gestation of pregnancy: Secondary | ICD-10-CM

## 2017-06-28 DIAGNOSIS — O09529 Supervision of elderly multigravida, unspecified trimester: Secondary | ICD-10-CM

## 2017-06-28 DIAGNOSIS — Z362 Encounter for other antenatal screening follow-up: Secondary | ICD-10-CM

## 2017-06-28 DIAGNOSIS — O285 Abnormal chromosomal and genetic finding on antenatal screening of mother: Secondary | ICD-10-CM

## 2017-06-28 LAB — CHROMOSOMES ANALYSIS FOR CF

## 2017-06-28 NOTE — Progress Notes (Signed)
Genetic Counseling  High-Risk Gestation Note  Appointment Date:  06/28/2017 Referred By: Cheral MarkerBooker, Natalie R, CNM Date of Birth:  10/01/1980 Partner:  Gwendlyn DeutscherPhillip Smith   Pregnancy History: [id number]: G4P3003 Estimated Date of Delivery: 10/28/17 Estimated Gestational Age: 7034w4d Attending: Damaris HippoJeffrey Denney, MD   Ms. Natalie FillerDana M Paul was seen for genetic counseling because of previous screen positive Down syndrome risk on integrated screening. The patient is 37 y.o..      In summary:  Discussed increased risk for fetal aneuploidy with advanced maternal age  Reviewed results of Integrated screening  NT measurement reported to be 98%tile for gestational age  Reviewed etiologies associated with increased NT measurement  Reviewed results of patient's previous NIPS (MaterniT21) and ultrasound  Follow-up ultrasound performed today- see separate report  Discussed limitations of screening and option of diagnostic testing  Declined amniocentesis  Reviewed family history concerns  Discussed carrier screening options  CF- elected to pursue today [facility]  SMA- elected to pursue today [facility])  Hemoglobinopathies  She was counseled regarding maternal age and the association with risk for chromosome conditions due to nondisjunction with aging of the ova.   We reviewed chromosomes, nondisjunction, and the associated risk for fetal aneuploidy related to a maternal age of 37 y.o. at 434w4d gestation.  She was counseled that the risk for aneuploidy decreases as gestational age increases, accounting for those pregnancies which spontaneously abort.  We specifically discussed Down syndrome (trisomy 8021), trisomies 3713 and 5118, and sex chromosome aneuploidies (47,XXX and 47,XXY) including the common features and prognoses of each.   She was counseled regarding the integrated screening result and the associated 1 in 17 risk for fetal Down syndrome. We also discussed other  explanations for a screen positive result including: a gestational dating error, differences in maternal metabolism, and normal variation. Subsequently, [name] had noninvasive prenatal screening (MaterniT21 through [facility]) performed, which was screen negative for the conditions screened including Down syndrome, Trisomy 18 and Trisomy 13.   We discussed that the nuchal translucency (NT) ultrasound at the referring provider's office was a component of the integrated screen and revealed an NT measurement of 2.9 mm at a CRL of 59.9 mm. We discussed that the fetal NT refers to a fluid filled space between the skin and soft tissues behind the cervical spine. This space is traditionally measurable between 11 and 13.[redacted] weeks gestation and is considered enlarged when the measurement is equal to or greater than the 95th percentile for the gestational age. We discussed that the NT is approximately at the 98%tile for the gestational age. She was counseled regarding the various common etiologies for an enlarged NT including: aneuploidy, single gene conditions, cardiac or great vessel abnormalities, lymphatic system failure, decreased fetal movement, and fetal anemia. We discussed the option of screening for fetal heart defects by detailed ultrasound and fetal echocardiogram.   She was counseled that screening tests are used to modify a patient's a priori risk for aneuploidy, typically based on age. This estimate provides a pregnancy specific risk assessment. We reviewed the benefits and limitations of each option. Specifically, we discussed the conditions for which each test screens, the detection rates, and false positive rates of each. She was also counseled regarding diagnostic testing via amniocentesis. We reviewed the approximate 1 in 300-500 risk for complications from amniocentesis, including spontaneous pregnancy loss or preterm labor and delivery. We discussed the possible results that the tests  might provide including: positive, negative, unanticipated, and no result.  Finally, she were counseled regarding the cost of each option and potential out of pocket expenses. We also reviewed the benefits and limitations of detailed ultrasound as a screening tool for fetal aneuploidy. [facility]. Follow-up ultrasound was performed today to complete fetal anatomic survey. Complete ultrasound results reported under separate cover. After careful consideration, [name] declined amniocentesis.   [name] was provided with written information regarding cystic fibrosis (CF), spinal muscular atrophy (SMA) and hemoglobinopathies including the carrier frequency, availability of carrier screening and prenatal diagnosis if indicated.  In addition, we discussed that CF and hemoglobinopathies are routinely screened for as part of the [facility] newborn screening panel, and newborn screening for SMA is available under a research protocol for families in [place] who enroll in the Early Check program.  After further discussion, she elected to pursue screening for CF and SMA today through [facility].   Both family histories were reviewed and found to be contributory for a paternal aunt to the patient with Down syndrome. Her aunt is the youngest of  7 siblings and currently resides with a sister. We discussed that 95% of cases of Down syndrome are not inherited and are the result of non-disjunction.  Three to 4% of cases of Down syndrome are the result of a translocation involving chromosome #21.  We discussed the option of chromosome analysis to determine if an individual is a carrier of a balanced translocation involving chromosome #21.  If an individual carries a balanced translocation involving chromosome #21, then the chance to have a baby with Down syndrome would be greater than the maternal age-related  risk. We discussed that the reported family history is most suggestive of sporadically occurring Down syndrome. Without further information regarding the provided family history, an accurate genetic risk cannot be calculated. Further genetic counseling is warranted if more information is obtained.  Ms. Egley denied exposure to environmental toxins or chemical agents. She denied the use of street drugs. She reported alcohol use prior to being aware of the pregnancy. The all-or-none period was discussed, meaning exposures that occur in the first 4 weeks of gestation are typically thought to either not affect the pregnancy at all or result in a miscarriage. She reported smoking about a pack of cigarettes per day. The associations of smoking in pregnancy were reviewed and cessation encouraged. She denied significant viral illnesses during the course of her pregnancy.   I counseled [name] regarding the above risks and available options.  The approximate face-to-face time with the genetic counselor was 40 minutes.  Quinn Plowman, MS,  Certified Genetic Counselor 06/28/2017

## 2017-06-30 ENCOUNTER — Ambulatory Visit (INDEPENDENT_AMBULATORY_CARE_PROVIDER_SITE_OTHER): Payer: Medicaid Other | Admitting: Obstetrics and Gynecology

## 2017-06-30 VITALS — BP 105/65 | HR 63 | Wt 192.2 lb

## 2017-06-30 DIAGNOSIS — Z3482 Encounter for supervision of other normal pregnancy, second trimester: Secondary | ICD-10-CM

## 2017-06-30 DIAGNOSIS — O09522 Supervision of elderly multigravida, second trimester: Secondary | ICD-10-CM

## 2017-06-30 DIAGNOSIS — Z331 Pregnant state, incidental: Secondary | ICD-10-CM

## 2017-06-30 DIAGNOSIS — Z1389 Encounter for screening for other disorder: Secondary | ICD-10-CM

## 2017-06-30 DIAGNOSIS — Z3A22 22 weeks gestation of pregnancy: Secondary | ICD-10-CM

## 2017-06-30 LAB — POCT URINALYSIS DIPSTICK
Glucose, UA: NEGATIVE
Ketones, UA: NEGATIVE
LEUKOCYTES UA: NEGATIVE
NITRITE UA: NEGATIVE
PROTEIN UA: NEGATIVE
RBC UA: NEGATIVE

## 2017-06-30 NOTE — Progress Notes (Signed)
Patient ID: Natalie Paul Charrette, female   DOB: 11/02/1980, 37 y.o.   MRN: [id number]    LOW-RISK PREGNANCY VISIT Patient name: Natalie Paul Guitron MRN [id number]  Date of birth: 05/07/1980 Chief Complaint:   Routine prenatal care visit  History of Present Illness:   Natalie Paul Melamed is a 37 y.o. [id number] female at 5866w6d with an Estimated Date of Delivery: 10/28/17 being seen today for ongoing management of a low-risk pregnancy.  Bottle feeding plans MW:UXLKGBC:Tubal ligation papers need to be signed at 28 weeks, wants inpt BTL [place] and both work for [employer] Today she reports no complaints.  . Vag. Bleeding: None.  Movement: Present. denies leaking of fluid. Review of Systems:   Pertinent items are noted in HPI Denies abnormal vaginal discharge w/ itching/odor/irritation, headaches, visual changes, shortness of breath, chest pain, abdominal pain, severe nausea/vomiting, or problems with urination or bowel movements unless otherwise stated above. Pertinent History Reviewed:  Reviewed past medical,surgical, social, obstetrical and family history.  Reviewed problem list, medications and allergies. Physical Assessment:   Vitals:   06/30/17 1537  BP: 105/65  Pulse: 63  Weight: 192 lb 3.2 oz (87.2 kg)  Body mass index is 30.1 kg/Paul.        Physical Examination:   General appearance: Well appearing, and in no distress  Mental status: Alert, oriented to person, place, and time  Skin: Warm & dry  Cardiovascular: Normal heart rate noted  Respiratory: Normal respiratory effort, no distress  Abdomen: Soft, gravid, nontender  Pelvic: Cervical exam deferred         Extremities: Edema: None  Fetal Status: Fetal Heart Rate (bpm): 152 Fundal Height: 20 cm Movement: Present    Results for orders placed or performed in visit on 06/30/17 (from the past 24 hour(s))  POCT Urinalysis Dipstick   Collection Time: 06/30/17  3:38 PM  Result Value Ref Range   Color, UA     Clarity, UA     Glucose, UA Negative Negative   Bilirubin, UA     Ketones, UA neg    Spec Grav, UA  1.010 - 1.025   Blood, UA neg    pH, UA  5.0 - 8.0   Protein, UA Negative Negative   Urobilinogen, UA  0.2 or 1.0 E.U./dL   Nitrite, UA neg    Leukocytes, UA Negative Negative   Appearance     Odor      Assessment & Plan:  1) Low-risk pregnancy G4P3003 at 2066w6d with an Estimated Date of Delivery: 10/28/17    Meds: No orders of the defined types were placed in this encounter.  Labs/procedures today: None  Plan:  Continue routine obstetrical care. Sign tubal paper at 28 weeks  Follow-up: Return in about 1 month (around 07/28/2017) for growth U/S. PN2, and office visit.  Orders Placed This Encounter  Procedures  . POCT Urinalysis Dipstick   By signing my name below, I, Arnette NorrisMari Johnson, attest that this documentation has been prepared under the direction and in the presence of Tilda BurrowFerguson, Alam Guterrez V, MD. Electronically Signed: Arnette NorrisMari Johnson Medical Scribe. 06/30/17. 3:54 PM.  I personally performed the services described in this documentation, which was SCRIBED in my presence. The recorded information has been reviewed and considered accurate. It has been edited as necessary during review. Tilda BurrowJohn V Dellanira Dillow, MD

## 2017-07-12 ENCOUNTER — Telehealth (HOSPITAL_COMMUNITY): Payer: Self-pay | Admitting: MS"

## 2017-07-12 NOTE — Telephone Encounter (Signed)
Attempted to contact patient regarding results of cystic fibrosis carrier screening and spinal muscular atrophy (SMA) carrier screening, both of which are within normal limits. Left message for patient to return call.   Natalie BraunKaren Sabriah Hobbins 07/12/2017 9:42 AM

## 2017-07-12 NOTE — Telephone Encounter (Signed)
[name] returned call. Patient identified by name and DOB. Carrier screening for cystic fibrosis yielded a normal/negative result for the 65 most common disease-causing mutations, meaning that the risk to be a CF carrier can be reduced from 1 in 25 to approximately 1 in 250. 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. Thus, her risk to be a carrier for SMA has been reduced to approximately 1 in 632.  Therefore, the risk for both cystic fibrosis and SMA in her current pregnancy is significantly reduced. The patient understands that these carrier screens can reduce but not eliminate the chance for her to be a CF or SMA carrier. All questions answered to her satisfaction at this time.   Clydie BraunKaren Vaneza Pickart 07/12/2017 10:27 AM

## 2017-07-23 ENCOUNTER — Other Ambulatory Visit (HOSPITAL_COMMUNITY): Payer: Self-pay

## 2017-07-26 ENCOUNTER — Ambulatory Visit (HOSPITAL_COMMUNITY): Payer: Medicaid Other

## 2017-07-26 ENCOUNTER — Encounter (HOSPITAL_COMMUNITY): Payer: Self-pay

## 2017-07-27 ENCOUNTER — Other Ambulatory Visit: Payer: Self-pay | Admitting: Obstetrics and Gynecology

## 2017-07-27 DIAGNOSIS — O285 Abnormal chromosomal and genetic finding on antenatal screening of mother: Secondary | ICD-10-CM

## 2017-07-27 DIAGNOSIS — O09522 Supervision of elderly multigravida, second trimester: Secondary | ICD-10-CM

## 2017-07-29 ENCOUNTER — Encounter: Payer: Self-pay | Admitting: Advanced Practice Midwife

## 2017-07-29 ENCOUNTER — Other Ambulatory Visit: Payer: Medicaid Other

## 2017-07-29 ENCOUNTER — Ambulatory Visit (INDEPENDENT_AMBULATORY_CARE_PROVIDER_SITE_OTHER): Payer: Medicaid Other | Admitting: Advanced Practice Midwife

## 2017-07-29 ENCOUNTER — Other Ambulatory Visit: Payer: Self-pay

## 2017-07-29 ENCOUNTER — Ambulatory Visit (INDEPENDENT_AMBULATORY_CARE_PROVIDER_SITE_OTHER): Payer: Medicaid Other

## 2017-07-29 VITALS — BP 111/82 | HR 68 | Wt 196.0 lb

## 2017-07-29 DIAGNOSIS — Z3A27 27 weeks gestation of pregnancy: Secondary | ICD-10-CM

## 2017-07-29 DIAGNOSIS — F1120 Opioid dependence, uncomplicated: Secondary | ICD-10-CM

## 2017-07-29 DIAGNOSIS — O99322 Drug use complicating pregnancy, second trimester: Secondary | ICD-10-CM

## 2017-07-29 DIAGNOSIS — O285 Abnormal chromosomal and genetic finding on antenatal screening of mother: Secondary | ICD-10-CM | POA: Diagnosis not present

## 2017-07-29 DIAGNOSIS — Z131 Encounter for screening for diabetes mellitus: Secondary | ICD-10-CM

## 2017-07-29 DIAGNOSIS — Z331 Pregnant state, incidental: Secondary | ICD-10-CM

## 2017-07-29 DIAGNOSIS — Z3402 Encounter for supervision of normal first pregnancy, second trimester: Secondary | ICD-10-CM

## 2017-07-29 DIAGNOSIS — O09522 Supervision of elderly multigravida, second trimester: Secondary | ICD-10-CM

## 2017-07-29 DIAGNOSIS — Z1389 Encounter for screening for other disorder: Secondary | ICD-10-CM

## 2017-07-29 DIAGNOSIS — Z3482 Encounter for supervision of other normal pregnancy, second trimester: Secondary | ICD-10-CM | POA: Diagnosis not present

## 2017-07-29 DIAGNOSIS — O99320 Drug use complicating pregnancy, unspecified trimester: Secondary | ICD-10-CM

## 2017-07-29 DIAGNOSIS — O0992 Supervision of high risk pregnancy, unspecified, second trimester: Secondary | ICD-10-CM

## 2017-07-29 LAB — POCT URINALYSIS DIPSTICK
GLUCOSE UA: NEGATIVE
Ketones, UA: NEGATIVE
LEUKOCYTES UA: NEGATIVE
Nitrite, UA: NEGATIVE
Protein, UA: NEGATIVE
RBC UA: NEGATIVE

## 2017-07-29 NOTE — Progress Notes (Signed)
[place] 27 wks,breech,anterior pl gr 0, cx 3.9 cm,normal ovaries bilat,afi 19 cm,fhr 137 bpm,efw 1112g 67%

## 2017-07-29 NOTE — Progress Notes (Signed)
HIGH-RISK PREGNANCY VISIT Patient name: RANESSA KOSTA MRN [id number]  Date of birth: 08-27-1980 Chief Complaint:   Routine Prenatal Visit (PN2 & u/s today)  History of Present Illness:   JOCLYN ALSOBROOK is a 37 y.o. [phone] female at [redacted]w[redacted]d with an Estimated Date of Delivery: 10/28/17 being seen today for ongoing management of a high-risk pregnancy complicated by subutex use and AMA <40.  Today she reports no complaints.  . Vag. Bleeding: None.  Movement: Present. denies leaking of fluid.  Review of Systems:   Pertinent items are noted in HPI Denies abnormal vaginal discharge w/ itching/odor/irritation, headaches, visual changes, shortness of breath, chest pain, abdominal pain, severe nausea/vomiting, or problems with urination or bowel movements unless otherwise stated above.    Pertinent History Reviewed:  Medical & Surgical Hx:   Past Medical History:  Diagnosis Date  . Substance abuse [facility])    Past Surgical History:  Procedure Laterality Date  . FRACTURE SURGERY     Family History  Problem Relation Age of Onset  . Cancer Unknown   . Cancer Maternal Grandmother   . Heart attack Maternal Grandfather     Current Outpatient Medications:  .  buprenorphine (SUBUTEX) 8 MG SUBL SL tablet, place ONE TABLET UNDER THE TONGUE TWICE DAILY, Disp: , Rfl: 0 .  FOLIC ACID PO, Take by mouth daily., Disp: , Rfl:  .  Pediatric Multiple Vit-C-FA (FLINSTONES GUMMIES OMEGA-3 DHA) CHEW, Chew by mouth daily., Disp: , Rfl:  Social History: Reviewed -  reports that she has been smoking cigarettes.  She has been smoking about 0.50 packs per day. She has never used smokeless tobacco.   Physical Assessment:   Vitals:   07/29/17 1019  BP: 111/82  Pulse: 68  Weight: 196 lb (88.9 kg)  Body mass index is 30.7 kg/m.           Physical Examination:   General appearance: alert, well appearing, and in no distress  Mental status: alert, oriented to person, place, and time  Skin: warm & dry   Extremities:  Edema: Other (Comment)    Cardiovascular: normal heart rate noted  Respiratory: normal respiratory effort, no distress  Abdomen: gravid, soft, non-tender  Pelvic: Cervical exam deferred         Fetal Status: Fetal Heart Rate (bpm): 137   Movement: Present    Fetal Surveillance Testing today: [place] 27 wks,breech,anterior pl gr 0, cx 3.9 cm,normal ovaries bilat,afi 19 cm,fhr 137 bpm,efw 1112g 67%    Results for orders placed or performed in visit on 07/29/17 (from the past 24 hour(s))  POCT urinalysis dipstick   Collection Time: 07/29/17 10:22 AM  Result Value Ref Range   Color, UA     Clarity, UA     Glucose, UA Negative Negative   Bilirubin, UA     Ketones, UA neg    Spec Grav, UA  1.010 - 1.025   Blood, UA neg    pH, UA  5.0 - 8.0   Protein, UA Negative Negative   Urobilinogen, UA  0.2 or 1.0 E.U./dL   Nitrite, UA neg    Leukocytes, UA Negative Negative   Appearance     Odor      Assessment & Plan:  1) High-risk pregnancy G4P3003 at [redacted]w[redacted]d with an Estimated Date of Delivery: 10/28/17   2) Subutex maintenacne, stable.  NAS consult scheduled; EFW q 4 weeks  3) AMA, <40 so no testing necessary,   4)   Increased NT w/normal CfDNA/US:  Fetal echo 7/15; if normal, no more testing  Labs/procedures today: PN2  Treatment Plan:  [facility]--order placed in Epic)  Follow-up: Return in about 3 weeks (around 08/19/2017) for HROB.  Orders Placed This Encounter  Procedures  . US OB Follow Up  . POCT urinalysis dipstick   Jacklyn ShellFrances Cresenzo-Dishmon CNM 07/29/2017 3:47 PM

## 2017-07-30 LAB — CBC
HEMOGLOBIN: 13.1 g/dL (ref 11.1–15.9)
Hematocrit: 38.8 % (ref 34.0–46.6)
MCH: 31.5 pg (ref 26.6–33.0)
MCHC: 33.8 g/dL (ref 31.5–35.7)
MCV: 93 fL (ref 79–97)
PLATELETS: 204 10*3/uL (ref 150–450)
RBC: 4.16 x10E6/uL (ref 3.77–5.28)
RDW: 14.2 % (ref 12.3–15.4)
WBC: 14.5 10*3/uL — ABNORMAL HIGH (ref 3.4–10.8)

## 2017-07-30 LAB — RPR: RPR: NONREACTIVE

## 2017-07-30 LAB — ANTIBODY SCREEN: Antibody Screen: NEGATIVE

## 2017-07-30 LAB — GLUCOSE TOLERANCE, 2 HOURS W/ 1HR
GLUCOSE, 1 HOUR: 138 mg/dL (ref 65–179)
GLUCOSE, 2 HOUR: 78 mg/dL (ref 65–152)
Glucose, Fasting: 78 mg/dL (ref 65–91)

## 2017-07-30 LAB — HIV ANTIBODY (ROUTINE TESTING W REFLEX): HIV Screen 4th Generation wRfx: NONREACTIVE

## 2017-08-02 ENCOUNTER — Encounter (HOSPITAL_COMMUNITY): Payer: Self-pay

## 2017-08-02 DIAGNOSIS — O368391 Maternal care for abnormalities of the fetal heart rate or rhythm, unspecified trimester, fetus 1: Secondary | ICD-10-CM | POA: Diagnosis not present

## 2017-08-02 DIAGNOSIS — Z3A27 27 weeks gestation of pregnancy: Secondary | ICD-10-CM | POA: Diagnosis not present

## 2017-08-02 DIAGNOSIS — O283 Abnormal ultrasonic finding on antenatal screening of mother: Secondary | ICD-10-CM | POA: Insufficient documentation

## 2017-08-02 DIAGNOSIS — O368390 Maternal care for abnormalities of the fetal heart rate or rhythm, unspecified trimester, not applicable or unspecified: Secondary | ICD-10-CM | POA: Insufficient documentation

## 2017-08-19 ENCOUNTER — Ambulatory Visit (INDEPENDENT_AMBULATORY_CARE_PROVIDER_SITE_OTHER): Payer: Medicaid Other | Admitting: Obstetrics & Gynecology

## 2017-08-19 ENCOUNTER — Encounter: Payer: Self-pay | Admitting: Obstetrics & Gynecology

## 2017-08-19 VITALS — BP 108/73 | HR 77 | Wt 192.0 lb

## 2017-08-19 DIAGNOSIS — O283 Abnormal ultrasonic finding on antenatal screening of mother: Secondary | ICD-10-CM

## 2017-08-19 DIAGNOSIS — Z3A30 30 weeks gestation of pregnancy: Secondary | ICD-10-CM

## 2017-08-19 DIAGNOSIS — Z1389 Encounter for screening for other disorder: Secondary | ICD-10-CM

## 2017-08-19 DIAGNOSIS — Z331 Pregnant state, incidental: Secondary | ICD-10-CM

## 2017-08-19 DIAGNOSIS — F1120 Opioid dependence, uncomplicated: Secondary | ICD-10-CM

## 2017-08-19 DIAGNOSIS — O99320 Drug use complicating pregnancy, unspecified trimester: Secondary | ICD-10-CM

## 2017-08-19 DIAGNOSIS — O0992 Supervision of high risk pregnancy, unspecified, second trimester: Secondary | ICD-10-CM

## 2017-08-19 DIAGNOSIS — O09522 Supervision of elderly multigravida, second trimester: Secondary | ICD-10-CM

## 2017-08-19 LAB — POCT URINALYSIS DIPSTICK OB
Blood, UA: NEGATIVE
Glucose, UA: NEGATIVE — AB
KETONES UA: NEGATIVE
LEUKOCYTES UA: NEGATIVE
NITRITE UA: NEGATIVE
PROTEIN: NEGATIVE

## 2017-08-19 NOTE — Progress Notes (Signed)
   HIGH-RISK PREGNANCY VISIT Patient name: Natalie Paul MRN [id number]  Date of birth: 01/23/1980 Chief Complaint:   Routine Prenatal Visit  History of Present Illness:   Natalie Paul is a 37 y.o. [id number] female at 6042w0d with an Estimated Date of Delivery: 10/28/17 being seen today for ongoing management of a high-risk pregnancy complicated by subutex therapy, elevated NT, AMA.  Today she reports no complaints.  . Vag. Bleeding: None.  Movement: Present. denies leaking of fluid.  Review of Systems:   Pertinent items are noted in HPI Denies abnormal vaginal discharge w/ itching/odor/irritation, headaches, visual changes, shortness of breath, chest pain, abdominal pain, severe nausea/vomiting, or problems with urination or bowel movements unless otherwise stated above. Pertinent History Reviewed:  Reviewed past medical,surgical, social, obstetrical and family history.  Reviewed problem list, medications and allergies. Physical Assessment:   Vitals:   08/19/17 1612  BP: 108/73  Pulse: 77  Weight: 192 lb (87.1 kg)  Body mass index is 30.07 kg/m.           Physical Examination:   General appearance: alert, well appearing, and in no distress  Mental status: alert, oriented to person, place, and time  Skin: warm & dry   Extremities: Edema: None    Cardiovascular: normal heart rate noted  Respiratory: normal respiratory effort, no distress  Abdomen: gravid, soft, non-tender  Pelvic: Cervical exam deferred         Fetal Status:     Movement: Present    Fetal Surveillance Testing today: FHR 136   Results for orders placed or performed in visit on 08/19/17 (from the past 24 hour(s))  POC Urinalysis Dipstick OB   Collection Time: 08/19/17  4:13 PM  Result Value Ref Range   Color, UA     Clarity, UA     Glucose, UA Negative (A) (none)   Bilirubin, UA     Ketones, UA neg    Spec Grav, UA  1.010 - 1.025   Blood, UA neg    pH, UA  5.0 - 8.0   POC Protein UA Negative Negative, Trace     Urobilinogen, UA  0.2 or 1.0 E.U./dL   Nitrite, UA neg    Leukocytes, UA Negative Negative   Appearance     Odor      Assessment & Plan:  1) High-risk pregnancy G4P3003 at 3242w0d with an Estimated Date of Delivery: 10/28/17   2) Subutex maintainence, stable, 8 mg BID  3) AMA, stable  Meds: No orders of the defined types were placed in this encounter.   Labs/procedures today: none  Treatment Plan:  Sonogram 2 weeks  Reviewed: Term labor symptoms and general obstetric precautions including but not limited to vaginal bleeding, contractions, leaking of fluid and fetal movement were reviewed in detail with the patient.  All questions were answered.  Follow-up: Return in about 2 weeks (around 09/02/2017) for OB sonogram EFW, , HROB.  Orders Placed This Encounter  Procedures  . US OB Follow Up  . POC Urinalysis Dipstick OB   Lazaro ArmsLuther H Kianah Harries  08/19/2017 4:29 PM

## 2017-08-23 ENCOUNTER — Ambulatory Visit (HOSPITAL_COMMUNITY): Payer: Medicaid Other

## 2017-09-01 ENCOUNTER — Encounter: Payer: Self-pay | Admitting: Obstetrics and Gynecology

## 2017-09-01 ENCOUNTER — Ambulatory Visit (INDEPENDENT_AMBULATORY_CARE_PROVIDER_SITE_OTHER): Payer: Medicaid Other | Admitting: Obstetrics and Gynecology

## 2017-09-01 ENCOUNTER — Other Ambulatory Visit: Payer: Self-pay

## 2017-09-01 ENCOUNTER — Ambulatory Visit (INDEPENDENT_AMBULATORY_CARE_PROVIDER_SITE_OTHER): Payer: Medicaid Other

## 2017-09-01 VITALS — BP 119/73 | HR 65 | Wt 191.6 lb

## 2017-09-01 DIAGNOSIS — F1120 Opioid dependence, uncomplicated: Secondary | ICD-10-CM | POA: Diagnosis not present

## 2017-09-01 DIAGNOSIS — O09523 Supervision of elderly multigravida, third trimester: Secondary | ICD-10-CM

## 2017-09-01 DIAGNOSIS — Z3403 Encounter for supervision of normal first pregnancy, third trimester: Secondary | ICD-10-CM

## 2017-09-01 DIAGNOSIS — Z331 Pregnant state, incidental: Secondary | ICD-10-CM

## 2017-09-01 DIAGNOSIS — Z3483 Encounter for supervision of other normal pregnancy, third trimester: Secondary | ICD-10-CM

## 2017-09-01 DIAGNOSIS — O283 Abnormal ultrasonic finding on antenatal screening of mother: Secondary | ICD-10-CM | POA: Diagnosis not present

## 2017-09-01 DIAGNOSIS — O0992 Supervision of high risk pregnancy, unspecified, second trimester: Secondary | ICD-10-CM

## 2017-09-01 DIAGNOSIS — Z3A31 31 weeks gestation of pregnancy: Secondary | ICD-10-CM

## 2017-09-01 DIAGNOSIS — Z1389 Encounter for screening for other disorder: Secondary | ICD-10-CM

## 2017-09-01 DIAGNOSIS — O99320 Drug use complicating pregnancy, unspecified trimester: Secondary | ICD-10-CM

## 2017-09-01 LAB — POCT URINALYSIS DIPSTICK OB
Blood, UA: NEGATIVE
Glucose, UA: NEGATIVE — AB
KETONES UA: NEGATIVE
Leukocytes, UA: NEGATIVE
NITRITE UA: NEGATIVE

## 2017-09-01 NOTE — Progress Notes (Signed)
Patient ID: Natalie Paul Peyton, female   DOB: 02/10/1980, 37 y.o.   MRN: [id number]    [facility] Patient name: Natalie Paul Lukins MRN [id number]  Date of birth: 08/26/1980 Chief Complaint:   Routine Prenatal Visit  History of Present Illness:   Natalie Paul Trompeter is a 37 y.o. [id number] female at 8384w6d with an Estimated Date of Delivery: 10/28/17 being seen today for ongoing management of a high-risk pregnancy complicated by AMA, subutex therapy. She has 3 children, her first child being 2 weeks after due date, second child 2 weeks early and third child on schedule. Birth control is tubal after delivery; consent forms have been singed. Today she reports no complaints.  .  .   . denies leaking of fluid.  Review of Systems:   Pertinent items are noted in HPI Denies abnormal vaginal discharge w/ itching/odor/irritation, headaches, visual changes, shortness of breath, chest pain, abdominal pain, severe nausea/vomiting, or problems with urination or bowel movements unless otherwise stated above. Pertinent History Reviewed:  Reviewed past medical,surgical, social, obstetrical and family history.  Reviewed problem list, medications and allergies. Physical Assessment:  There were no vitals filed for this visit.There is no height or weight on file to calculate BMI.           Physical Examination:   General appearance: alert, well appearing, and in no distress and oriented to person, place, and time  Mental status: alert, oriented to person, place, and time, normal mood, behavior, speech, dress, motor activity, and thought processes, affect appropriate to mood  Skin: warm & dry   Extremities:      Cardiovascular: normal heart rate noted  Respiratory: normal respiratory effort, no distress  Abdomen: gravid, soft, non-tender  Pelvic: Cervical exam deferred         Fetal Status:          Fetal Surveillance Testing today: US US 31+6 wks,cephalic,anterior pl gr 0,AFI 13 cm,fhr 127 bpm,efw 1949 g 54%  No  results found for this or any previous visit (from the past 24 hour(s)).  Assessment & Plan:  1) High-risk pregnancy G4P3003 at 3984w6d with an Estimated Date of Delivery: 10/28/17 ,subutex=> growth Q 4 wk., NAS consult Scheduled     2) AMA, stable, to del at 40 wk  3. Contraception management: BTL papers signed 4) support:  Limit support:partner in [place]. Needs to coordinate his travel with delivey plans. Meds: No orders of the defined types were placed in this encounter.   Labs/procedures today: U/S  Treatment Plan:  Continue routine obstetrical care   Follow-up: No follow-ups on file.  Orders Placed This Encounter  Procedures  . POC Urinalysis Dipstick OB   By signing my name below, I, Arnette NorrisMari Johnson, attest that this documentation has been prepared under the direction and in the presence of Tilda BurrowFerguson, Anthonella Klausner V, MD. Electronically Signed: Arnette NorrisMari Johnson Medical Scribe. 09/01/17. 4:04 PM.  I personally performed the services described in this documentation, which was SCRIBED in my presence. The recorded information has been reviewed and considered accurate. It has been edited as necessary during review. Tilda BurrowJohn V Jaelene Garciagarcia, MD

## 2017-09-01 NOTE — Progress Notes (Signed)
US 31+6 wks,cephalic,anterior pl gr 0,AFI 13 cm,fhr 127 bpm,efw 1949 g 54%

## 2017-09-07 ENCOUNTER — Telehealth: Payer: Self-pay | Admitting: Obstetrics & Gynecology

## 2017-09-07 NOTE — Telephone Encounter (Signed)
Patent states she has had the chicken pox twice as a child but has never had blood work to confirm.  Child with suspected chicken pox is going to dr this afternoon.  Advised to call us back if confirmed or any other airborne infection.  Verbalized understanding.

## 2017-09-15 ENCOUNTER — Ambulatory Visit (INDEPENDENT_AMBULATORY_CARE_PROVIDER_SITE_OTHER): Payer: Medicaid Other | Admitting: Obstetrics and Gynecology

## 2017-09-15 ENCOUNTER — Encounter: Payer: Self-pay | Admitting: Obstetrics and Gynecology

## 2017-09-15 ENCOUNTER — Other Ambulatory Visit: Payer: Self-pay

## 2017-09-15 VITALS — BP 119/74 | HR 77 | Wt 188.0 lb

## 2017-09-15 DIAGNOSIS — Z1389 Encounter for screening for other disorder: Secondary | ICD-10-CM

## 2017-09-15 DIAGNOSIS — Z23 Encounter for immunization: Secondary | ICD-10-CM | POA: Diagnosis not present

## 2017-09-15 DIAGNOSIS — Z3483 Encounter for supervision of other normal pregnancy, third trimester: Secondary | ICD-10-CM

## 2017-09-15 DIAGNOSIS — Z331 Pregnant state, incidental: Secondary | ICD-10-CM

## 2017-09-15 DIAGNOSIS — O09523 Supervision of elderly multigravida, third trimester: Secondary | ICD-10-CM

## 2017-09-15 DIAGNOSIS — Z3A33 33 weeks gestation of pregnancy: Secondary | ICD-10-CM

## 2017-09-15 LAB — POCT URINALYSIS DIPSTICK OB
Blood, UA: NEGATIVE
GLUCOSE, UA: NEGATIVE
KETONES UA: NEGATIVE
Leukocytes, UA: NEGATIVE
Nitrite, UA: NEGATIVE
PROTEIN: NEGATIVE

## 2017-09-15 NOTE — Progress Notes (Signed)
Patient ID: Natalie Paul, female   DOB: 06/20/1980, [age] y.o.   MRN: [id number]    [facility] Patient name: Natalie Paul MRN [id number]  Date of birth: 05/14/1980 Chief Complaint:   Routine Prenatal Visit  History of Present Illness:   Natalie Paul is a 37 y.o. [id number] female at 6256w6d with an Estimated Date of Delivery: 10/28/17 being seen today for ongoing management of a high-risk pregnancy complicated by AMA, subutex therapy., now on 8 mg bid. Baby is staying very active. Dr. Cathey EndowBowen is prescribing subutex 8 mg, twice a day. She didn't breastfeed with her other 3 children but wants to breastfeed with this child Today she reports no complaints. Contractions: Not present. Vag. Bleeding: None.  Movement: Present. denies leaking of fluid.  Review of Systems:   Pertinent items are noted in HPI Denies abnormal vaginal discharge w/ itching/odor/irritation, headaches, visual changes, shortness of breath, chest pain, abdominal pain, severe nausea/vomiting, or problems with urination or bowel movements unless otherwise stated above. Pertinent History Reviewed:  Reviewed past medical,surgical, social, obstetrical and family history.  Reviewed problem list, medications and allergies. Physical Assessment:   Vitals:   09/15/17 1548  BP: 119/74  Pulse: 77  Weight: 188 lb (85.3 kg)  Body mass index is 29.44 kg/m.           Physical Examination:   General appearance: alert, well appearing, and in no distress and oriented to person, place, and time  Mental status: alert, oriented to person, place, and time, normal mood, behavior, speech, dress, motor activity, and thought processes, affect appropriate to mood  Skin: warm & dry   Extremities: Edema: None    Cardiovascular: normal heart rate noted  Respiratory: normal respiratory effort, no distress  Abdomen: gravid, soft, non-tender  Pelvic: Cervical exam deferred         Fetal Status: Fetal Heart Rate (bpm): 129 Fundal Height: 33 cm  Movement: Present    Fetal Surveillance Testing today:  None  Results for orders placed or performed in visit on 09/15/17 (from the past 24 hour(s))  POC Urinalysis Dipstick OB   Collection Time: 09/15/17  3:48 PM  Result Value Ref Range   Color, UA     Clarity, UA     Glucose, UA Negative Negative   Bilirubin, UA     Ketones, UA neg    Spec Grav, UA     Blood, UA neg    pH, UA     POC Protein UA Negative Negative, Trace   Urobilinogen, UA     Nitrite, UA neg    Leukocytes, UA Negative Negative   Appearance     Odor      Assessment & Plan:  1) High-risk pregnancy G4P3003 at 4156w6d with an Estimated Date of Delivery: 10/28/17   2) AMA & subutex therapy, breast feeding planned, 3. Contraception plans: tubal planned.  Meds: No orders of the defined types were placed in this encounter.   Labs/procedures today: T-dap  Treatment Plan:   F/u in 2 weeks   Follow-up: No follow-ups on file.  Orders Placed This Encounter  Procedures  . Tdap vaccine greater than or equal to 7yo IM  . POC Urinalysis Dipstick OB   By signing my name below, I, Arnette NorrisMari Johnson, attest that this documentation has been prepared under the direction and in the presence of Tilda BurrowFerguson, Chanika Byland V, MD. Electronically Signed: Arnette NorrisMari Johnson Medical Scribe. 09/15/17. 4:01 PM.  I personally performed the services described in  this documentation, which was SCRIBED in my presence. The recorded information has been reviewed and considered accurate. It has been edited as necessary during review. Jonnie Kind, MD

## 2017-09-21 ENCOUNTER — Ambulatory Visit (HOSPITAL_COMMUNITY): Payer: Medicaid Other

## 2017-09-29 ENCOUNTER — Ambulatory Visit (INDEPENDENT_AMBULATORY_CARE_PROVIDER_SITE_OTHER): Payer: Medicaid Other | Admitting: Obstetrics and Gynecology

## 2017-09-29 ENCOUNTER — Encounter: Payer: Self-pay | Admitting: Obstetrics and Gynecology

## 2017-09-29 VITALS — BP 100/70 | HR 77 | Wt 187.0 lb

## 2017-09-29 DIAGNOSIS — Z331 Pregnant state, incidental: Secondary | ICD-10-CM

## 2017-09-29 DIAGNOSIS — O09523 Supervision of elderly multigravida, third trimester: Secondary | ICD-10-CM

## 2017-09-29 DIAGNOSIS — Z3483 Encounter for supervision of other normal pregnancy, third trimester: Secondary | ICD-10-CM

## 2017-09-29 DIAGNOSIS — Z1389 Encounter for screening for other disorder: Secondary | ICD-10-CM

## 2017-09-29 DIAGNOSIS — Z3A35 35 weeks gestation of pregnancy: Secondary | ICD-10-CM

## 2017-09-29 LAB — POCT URINALYSIS DIPSTICK OB
Glucose, UA: NEGATIVE
Leukocytes, UA: NEGATIVE
Nitrite, UA: NEGATIVE
POC,PROTEIN,UA: NEGATIVE
RBC UA: NEGATIVE

## 2017-09-29 NOTE — Progress Notes (Addendum)
Patient ID: MARIANA DOORLEY, female   DOB: 04/19/1980, 37 y.o.   MRN: [id number]    [facility] PREGNANCY VISIT Patient name: Natalie Paul MRN [id number]  Date of birth: [birth year] Chief Complaint:   Routine Prenatal Visit  History of Present Illness:   Natalie Paul is a 37 y.o. [id number] female at [redacted]w[redacted]d with an Estimated Date of Delivery: 10/28/17 being seen today for ongoing management of a high-risk pregnancy complicated by AMA, subutex therapy.  Today she reports no complaints. Contractions: Not present.  .  Movement: Present. denies leaking of fluid.  Review of Systems:   Pertinent items are noted in HPI Denies abnormal vaginal discharge w/ itching/odor/irritation, headaches, visual changes, shortness of breath, chest pain, abdominal pain, severe nausea/vomiting, or problems with urination or bowel movements unless otherwise stated above. Pertinent History Reviewed:  Reviewed past medical,surgical, social, obstetrical and family history.  Reviewed problem list, medications and allergies. She weighed over 250 with first 2 pregnancies. Physical Assessment:   Vitals:   09/29/17 1614  BP: 100/70  Pulse: 77  Weight: 187 lb (84.8 kg)  Body mass index is 29.29 kg/m.           Physical Examination:   General appearance: alert, well appearing, and in no distress and oriented to person, place, and time  Mental status: alert, oriented to person, place, and time, normal mood, behavior, speech, dress, motor activity, and thought processes, affect appropriate to mood  Skin: warm & dry   Extremities: Edema: None    Cardiovascular: normal heart rate noted  Respiratory: normal respiratory effort, no distress  Abdomen: gravid, soft, non-tender  Pelvic: Cervical exam deferred         Fetal Status: Fetal Heart Rate (bpm): 133 Fundal Height: 34 cm Movement: Present    Fetal Surveillance Testing today: None  Results for orders placed or performed in visit on 09/29/17 (from the past 24 hour(s))  POC  Urinalysis Dipstick OB   Collection Time: 09/29/17  4:15 PM  Result Value Ref Range   Color, UA     Clarity, UA     Glucose, UA Negative Negative   Bilirubin, UA     Ketones, UA small    Spec Grav, UA     Blood, UA neg    pH, UA     POC Protein UA Negative Negative, Trace   Urobilinogen, UA     Nitrite, UA neg    Leukocytes, UA Negative Negative   Appearance     Odor      Assessment & Plan:  1) High-risk pregnancy G4P3003 at [redacted]w[redacted]d with an Estimated Date of Delivery: 10/28/17   2) AMA, stable will obtain growth scan next week.  Meds: No orders of the defined types were placed in this encounter.   Labs/procedures today: none  Treatment Plan:  Growth u/s in 1 week  Follow-up: No follow-ups on file.  Orders Placed This Encounter  Procedures  . POC Urinalysis Dipstick OB    By signing my name below, I, [name], attest that this documentation has been prepared under the direction and in the presence of Tilda Burrow, MD. Electronically Signed: [name] Medical Scribe. 09/29/17. 4:26 PM.  I personally performed the services described in this documentation, which was SCRIBED in my presence. The recorded information has been reviewed and considered accurate. It has been edited as necessary during review. Tilda Burrow, MD

## 2017-10-06 ENCOUNTER — Ambulatory Visit (INDEPENDENT_AMBULATORY_CARE_PROVIDER_SITE_OTHER): Payer: Medicaid Other

## 2017-10-06 ENCOUNTER — Ambulatory Visit (INDEPENDENT_AMBULATORY_CARE_PROVIDER_SITE_OTHER): Payer: Medicaid Other | Admitting: Women's Health

## 2017-10-06 ENCOUNTER — Encounter: Payer: Self-pay | Admitting: Women's Health

## 2017-10-06 VITALS — BP 102/73 | HR 67 | Wt 189.0 lb

## 2017-10-06 DIAGNOSIS — O99320 Drug use complicating pregnancy, unspecified trimester: Secondary | ICD-10-CM | POA: Diagnosis not present

## 2017-10-06 DIAGNOSIS — Z1389 Encounter for screening for other disorder: Secondary | ICD-10-CM

## 2017-10-06 DIAGNOSIS — Z23 Encounter for immunization: Secondary | ICD-10-CM

## 2017-10-06 DIAGNOSIS — F1120 Opioid dependence, uncomplicated: Secondary | ICD-10-CM

## 2017-10-06 DIAGNOSIS — F1290 Cannabis use, unspecified, uncomplicated: Secondary | ICD-10-CM

## 2017-10-06 DIAGNOSIS — O09522 Supervision of elderly multigravida, second trimester: Secondary | ICD-10-CM

## 2017-10-06 DIAGNOSIS — Z3A36 36 weeks gestation of pregnancy: Secondary | ICD-10-CM | POA: Diagnosis not present

## 2017-10-06 DIAGNOSIS — O99323 Drug use complicating pregnancy, third trimester: Secondary | ICD-10-CM

## 2017-10-06 DIAGNOSIS — Z3403 Encounter for supervision of normal first pregnancy, third trimester: Secondary | ICD-10-CM

## 2017-10-06 DIAGNOSIS — Z3483 Encounter for supervision of other normal pregnancy, third trimester: Secondary | ICD-10-CM

## 2017-10-06 DIAGNOSIS — O0993 Supervision of high risk pregnancy, unspecified, third trimester: Secondary | ICD-10-CM

## 2017-10-06 DIAGNOSIS — Z331 Pregnant state, incidental: Secondary | ICD-10-CM

## 2017-10-06 LAB — POCT URINALYSIS DIPSTICK OB
Glucose, UA: NEGATIVE
KETONES UA: NEGATIVE
Leukocytes, UA: NEGATIVE
Nitrite, UA: NEGATIVE
POC,PROTEIN,UA: NEGATIVE
RBC UA: NEGATIVE

## 2017-10-06 NOTE — Patient Instructions (Signed)
Gay Fillerana M Sebring, I greatly value your feedback.  If you receive a survey following your visit with us today, we appreciate you taking the time to fill it out.  Thanks, Joellyn HaffKim Clyda Smyth, CNM, WHNP-BC   Call the office [phone]) or go to [facility] if:  You begin to have strong, frequent contractions  Your water breaks.  Sometimes it is a big gush of fluid, sometimes it is just a trickle that keeps getting your panties wet or running down your legs  You have vaginal bleeding.  It is normal to have a small amount of spotting if your cervix was checked.   You don't feel your baby moving like normal.  If you don't, get you something to eat and drink and lay down and focus on feeling your baby move.  You should feel at least 10 movements in 2 hours.  If you don't, you should call the office or go to [facility].     [facility]xton Hicks Contractions Contractions of the uterus can occur throughout pregnancy, but they are not always a sign that you are in labor. You may have practice contractions called Braxton Hicks contractions. These false labor contractions are sometimes confused with true labor. What are Deberah PeltonBraxton Hicks contractions? Braxton Hicks contractions are tightening movements that occur in the muscles of the uterus before labor. Unlike true labor contractions, these contractions do not result in opening (dilation) and thinning of the cervix. Toward the end of pregnancy (32-34 weeks), Braxton Hicks contractions can happen more often and may become stronger. These contractions are sometimes difficult to tell apart from true labor because they can be very uncomfortable. You should not feel embarrassed if you go to the hospital with false labor. Sometimes, the only way to tell if you are in true labor is for your health care provider to look for changes in the cervix. The health care provider will do a physical exam and may monitor your contractions. If you are not in true labor, the exam should show  that your cervix is not dilating and your water has not broken. If there are other health problems associated with your pregnancy, it is completely safe for you to be sent home with false labor. You may continue to have Braxton Hicks contractions until you go into true labor. How to tell the difference between true labor and false labor True labor  Contractions last 30-70 seconds.  Contractions become very regular.  Discomfort is usually felt in the top of the uterus, and it spreads to the lower abdomen and low back.  Contractions do not go away with walking.  Contractions usually become more intense and increase in frequency.  The cervix dilates and gets thinner. False labor  Contractions are usually shorter and not as strong as true labor contractions.  Contractions are usually irregular.  Contractions are often felt in the front of the lower abdomen and in the groin.  Contractions may go away when you walk around or change positions while lying down.  Contractions get weaker and are shorter-lasting as time goes on.  The cervix usually does not dilate or become thin. Follow these instructions at home:  Take over-the-counter and prescription medicines only as told by your health care provider.  Keep up with your usual exercises and follow other instructions from your health care provider.  Eat and drink lightly if you think you are going into labor.  If Braxton Hicks contractions are making you uncomfortable: ? Change your position from lying  down or resting to walking, or change from walking to resting. ? Sit and rest in a tub of warm water. ? Drink enough fluid to keep your urine pale yellow. Dehydration may cause these contractions. ? Do slow and deep breathing several times an hour.  Keep all follow-up prenatal visits as told by your health care provider. This is important. Contact a health care provider if:  You have a fever.  You have continuous pain in your  abdomen. Get help right away if:  Your contractions become stronger, more regular, and closer together.  You have fluid leaking or gushing from your vagina.  You pass blood-tinged mucus (bloody show).  You have bleeding from your vagina.  You have low back pain that you never had before.  You feel your baby's head pushing down and causing pelvic pressure.  Your baby is not moving inside you as much as it used to. Summary  Contractions that occur before labor are called Braxton Hicks contractions, false labor, or practice contractions.  Braxton Hicks contractions are usually shorter, weaker, farther apart, and less regular than true labor contractions. True labor contractions usually become progressively stronger and regular and they become more frequent.  Manage discomfort from [facility] contractions by changing position, resting in a warm bath, drinking plenty of water, or practicing deep breathing. This information is not intended to replace advice given to you by your health care provider. Make sure you discuss any questions you have with your health care provider. Document Released: 05/21/2016 Document Revised: 05/21/2016 Document Reviewed: 05/21/2016 Elsevier Interactive Patient Education  2018 [employer].

## 2017-10-06 NOTE — Progress Notes (Signed)
[place] 36+6 wks,cephalic,anterior pl gr 2,afi 14 cm,fhr 126 bpm,bilat adnexa's wnl,efw 2869 g 37%

## 2017-10-06 NOTE — Progress Notes (Signed)
   HIGH-RISK PREGNANCY VISIT Patient name: Natalie Paul MRN [id number]  Date of birth: 06/15/1980 Chief Complaint:   Routine Prenatal Visit (gbs-gc-chl/ ultrasound)  History of Present Illness:   Natalie Paul is a 37 y.o. [phone] female at 910w6d with an Estimated Date of Delivery: 10/28/17 being seen today for ongoing management of a high-risk pregnancy complicated by T21 1:17, materniT21 neg, increased NT, subutex therapy, 37yo.  Today she reports no complaints. Hasn't made it to NAS tour, just hasn't worked out. Contractions: Not present.  .  Movement: Present. denies leaking of fluid.  Review of Systems:   Pertinent items are noted in HPI Denies abnormal vaginal discharge w/ itching/odor/irritation, headaches, visual changes, shortness of breath, chest pain, abdominal pain, severe nausea/vomiting, or problems with urination or bowel movements unless otherwise stated above. Pertinent History Reviewed:  Reviewed past medical,surgical, social, obstetrical and family history.  Reviewed problem list, medications and allergies. Physical Assessment:   Vitals:   10/06/17 0947  BP: 102/73  Pulse: 67  Weight: 189 lb (85.7 kg)  Body mass index is 29.6 kg/m.           Physical Examination:   General appearance: alert, well appearing, and in no distress  Mental status: alert, oriented to person, place, and time  Skin: warm & dry   Extremities: Edema: None    Cardiovascular: normal heart rate noted  Respiratory: normal respiratory effort, no distress  Abdomen: gravid, soft, non-tender  Pelvic: Cervical exam performed  Dilation: Fingertip Effacement (%): Thick Station: -2  Fetal Status: Fetal Heart Rate (bpm): 126 u/s Fundal Height: 35 cm Movement: Present Presentation: Vertex  Fetal Surveillance Testing today:Us 36+6 wks,cephalic,anterior pl gr 2,afi 14 cm,fhr 126 bpm,bilat adnexa's wnl,efw 2869 g 38%  Results for orders placed or performed in visit on 10/06/17 (from the past 24 hour(s))    POC Urinalysis Dipstick OB   Collection Time: 10/06/17  9:46 AM  Result Value Ref Range   Color, UA     Clarity, UA     Glucose, UA Negative Negative   Bilirubin, UA     Ketones, UA neg    Spec Grav, UA     Blood, UA neg    pH, UA     POC Protein UA Negative Negative, Trace   Urobilinogen, UA     Nitrite, UA neg    Leukocytes, UA Negative Negative   Appearance     Odor      Assessment & Plan:  1) High-risk pregnancy G4P3003 at 2610w6d with an Estimated Date of Delivery: 10/28/17   2) Increased r/f T21, increased NT, w/ neg MaterniT21, normal detailed u/s w/ MFM and neg fetal echo  3) Subutex, 8mg  BID  4) AMA 37yo  Meds: No orders of the defined types were placed in this encounter.   Labs/procedures today: gbs, gc/ct, sve, flu shot  Treatment Plan:  Weekly visits  Reviewed: Preterm labor symptoms and general obstetric precautions including but not limited to vaginal bleeding, contractions, leaking of fluid and fetal movement were reviewed in detail with the patient.  All questions were answered.  Follow-up: Return in about 1 week (around 10/13/2017) for HROB.  Orders Placed This Encounter  Procedures  . Strep Gp B NAA  . GC/Chlamydia Probe Amp  . Flu Vaccine QUAD 36+ mos IM (Fluarix, Quad PF)  . POC Urinalysis Dipstick OB   Cheral MarkerKimberly R Xaviar Lunn CNM, [facility]NP[facility]BC 10/06/2017 10:25 AM

## 2017-10-08 LAB — STREP GP B NAA: Strep Gp B NAA: POSITIVE — AB

## 2017-10-09 LAB — GC/CHLAMYDIA PROBE AMP
CHLAMYDIA, DNA PROBE: NEGATIVE
NEISSERIA GONORRHOEAE BY PCR: NEGATIVE

## 2017-10-13 ENCOUNTER — Ambulatory Visit (INDEPENDENT_AMBULATORY_CARE_PROVIDER_SITE_OTHER): Payer: Medicaid Other | Admitting: Advanced Practice Midwife

## 2017-10-13 ENCOUNTER — Encounter: Payer: Self-pay | Admitting: Advanced Practice Midwife

## 2017-10-13 VITALS — BP 112/77 | HR 63 | Wt 187.5 lb

## 2017-10-13 DIAGNOSIS — F1120 Opioid dependence, uncomplicated: Secondary | ICD-10-CM

## 2017-10-13 DIAGNOSIS — Z3A37 37 weeks gestation of pregnancy: Secondary | ICD-10-CM

## 2017-10-13 DIAGNOSIS — O09523 Supervision of elderly multigravida, third trimester: Secondary | ICD-10-CM

## 2017-10-13 DIAGNOSIS — O285 Abnormal chromosomal and genetic finding on antenatal screening of mother: Secondary | ICD-10-CM

## 2017-10-13 DIAGNOSIS — Z1389 Encounter for screening for other disorder: Secondary | ICD-10-CM

## 2017-10-13 DIAGNOSIS — O99320 Drug use complicating pregnancy, unspecified trimester: Secondary | ICD-10-CM

## 2017-10-13 DIAGNOSIS — O99323 Drug use complicating pregnancy, third trimester: Secondary | ICD-10-CM

## 2017-10-13 DIAGNOSIS — O0993 Supervision of high risk pregnancy, unspecified, third trimester: Secondary | ICD-10-CM

## 2017-10-13 DIAGNOSIS — O09522 Supervision of elderly multigravida, second trimester: Secondary | ICD-10-CM

## 2017-10-13 DIAGNOSIS — O283 Abnormal ultrasonic finding on antenatal screening of mother: Secondary | ICD-10-CM

## 2017-10-13 DIAGNOSIS — Z331 Pregnant state, incidental: Secondary | ICD-10-CM

## 2017-10-13 LAB — POCT URINALYSIS DIPSTICK OB
Blood, UA: NEGATIVE
GLUCOSE, UA: NEGATIVE
Ketones, UA: NEGATIVE
NITRITE UA: NEGATIVE

## 2017-10-13 NOTE — Patient Instructions (Signed)

## 2017-10-13 NOTE — Progress Notes (Signed)
HIGH-RISK PREGNANCY VISIT Patient name: Natalie Paul MRN [id number]  Date of birth: Nov 18, 1980 Chief Complaint:   High Risk Gestation  History of Present Illness:   Natalie Paul is a 37 y.o. [phone] female at [redacted]w[redacted]d with an Estimated Date of Delivery: 10/28/17 being seen today for ongoing management of a high-risk pregnancy complicated by . T21 1:17 w/NT/IT, materniT21 neg, increased NT, subutex therapy, 37yo. Today she reports vaginal pressure. Contractions: Not present. Vag. Bleeding: None.  Movement: Present. denies leaking of fluid.  Review of Systems:   Pertinent items are noted in HPI Denies abnormal vaginal discharge w/ itching/odor/irritation, headaches, visual changes, shortness of breath, chest pain, abdominal pain, severe nausea/vomiting, or problems with urination or bowel movements unless otherwise stated above.    Pertinent History Reviewed:  Medical & Surgical Hx:   Past Medical History:  Diagnosis Date  . Substance abuse [facility])    Past Surgical History:  Procedure Laterality Date  . FRACTURE SURGERY     Family History  Problem Relation Age of Onset  . Cancer Unknown   . Cancer Maternal Grandmother   . Heart attack Maternal Grandfather   . Breast cancer Sister     Current Outpatient Medications:  .  buprenorphine (SUBUTEX) 8 MG SUBL SL tablet, place ONE TABLET UNDER THE TONGUE TWICE DAILY, Disp: , Rfl: 0 .  FOLIC ACID PO, Take by mouth daily., Disp: , Rfl:  .  Pediatric Multiple Vit-C-FA (FLINSTONES GUMMIES OMEGA-3 DHA) CHEW, Chew by mouth daily., Disp: , Rfl:  Social History: Reviewed -  reports that she has been smoking cigarettes. She has been smoking about 0.50 packs per day. She has never used smokeless tobacco.   Physical Assessment:   Vitals:   10/13/17 0929  BP: 112/77  Pulse: 63  Weight: 187 lb 8 oz (85 kg)  Body mass index is 29.37 kg/m.           Physical Examination:   General appearance: alert, well appearing, and in no distress  Mental  status: alert, oriented to person, place, and time  Skin: warm & dry   Extremities: Edema: None    Cardiovascular: normal heart rate noted  Respiratory: normal respiratory effort, no distress  Abdomen: gravid, soft, non-tender  Pelvic: Cervical exam performed  Dilation: Fingertip Effacement (%): Thick Station: 0  Fetal Status: Fetal Heart Rate (bpm): 124 Fundal Height: 36 cm Movement: Present Presentation: Vertex  Fetal Surveillance Testing today: doppler  Results for orders placed or performed in visit on 10/13/17 (from the past 24 hour(s))  POC Urinalysis Dipstick OB   Collection Time: 10/13/17  9:36 AM  Result Value Ref Range   Color, UA     Clarity, UA     Glucose, UA Negative Negative   Bilirubin, UA     Ketones, UA neg    Spec Grav, UA     Blood, UA neg    pH, UA     POC Protein UA Trace Negative, Trace   Urobilinogen, UA     Nitrite, UA neg    Leukocytes, UA Trace (A) Negative   Appearance     Odor      Assessment & Plan:  1) High-risk pregnancy G4P3003 at [redacted]w[redacted]d with an Estimated Date of Delivery: 10/28/17   2) Subutex, 8mg  BID, stable  3) AMA,  37    Treatment Plan:  Weekly visits.  Doesn't plan on NAS tour .  Follow-up: Return in about 1 week (around 10/20/2017) for HROB.  Orders  Placed This Encounter  Procedures  . POC Urinalysis Dipstick OB   Jacklyn Shell CNM 10/13/2017 9:50 AM

## 2017-10-20 ENCOUNTER — Encounter: Payer: Self-pay | Admitting: Advanced Practice Midwife

## 2017-10-20 ENCOUNTER — Ambulatory Visit (INDEPENDENT_AMBULATORY_CARE_PROVIDER_SITE_OTHER): Payer: Medicaid Other | Admitting: Advanced Practice Midwife

## 2017-10-20 VITALS — BP 103/72 | HR 63 | Wt 188.0 lb

## 2017-10-20 DIAGNOSIS — Z1389 Encounter for screening for other disorder: Secondary | ICD-10-CM

## 2017-10-20 DIAGNOSIS — Z3A38 38 weeks gestation of pregnancy: Secondary | ICD-10-CM | POA: Diagnosis not present

## 2017-10-20 DIAGNOSIS — Z3403 Encounter for supervision of normal first pregnancy, third trimester: Secondary | ICD-10-CM

## 2017-10-20 DIAGNOSIS — O368130 Decreased fetal movements, third trimester, not applicable or unspecified: Secondary | ICD-10-CM | POA: Diagnosis not present

## 2017-10-20 DIAGNOSIS — Z331 Pregnant state, incidental: Secondary | ICD-10-CM

## 2017-10-20 LAB — POCT URINALYSIS DIPSTICK OB
Blood, UA: NEGATIVE
GLUCOSE, UA: NEGATIVE
KETONES UA: NEGATIVE
Leukocytes, UA: NEGATIVE
Nitrite, UA: NEGATIVE
POC,PROTEIN,UA: NEGATIVE

## 2017-10-20 NOTE — Progress Notes (Signed)
HIGH-RISK PREGNANCY VISIT Patient name: Natalie Paul MRN [id number]  Date of birth: Mar 23, 1980 Chief Complaint:   Routine Prenatal Visit (pressure/ decrease fetal movement)  History of Present Illness:   Natalie Paul is a 37 y.o. [phone] female at [redacted]w[redacted]d with an Estimated Date of Delivery: 10/28/17 being seen today for ongoing management of a high-risk pregnancy complicated by T21 1:17 w/NT/IT, materniT21 neg, increased NT, subutex therapy, 37yo. .  Today she reports no complaints. Contractions: Not present.  .  Movement: (!) Decreased. denies leaking of fluid.  Review of Systems:   Pertinent items are noted in HPI Denies abnormal vaginal discharge w/ itching/odor/irritation, headaches, visual changes, shortness of breath, chest pain, abdominal pain, severe nausea/vomiting, or problems with urination or bowel movements unless otherwise stated above.    Pertinent History Reviewed:  Medical & Surgical Hx:   Past Medical History:  Diagnosis Date  . Substance abuse [facility])    Past Surgical History:  Procedure Laterality Date  . FRACTURE SURGERY     Family History  Problem Relation Age of Onset  . Cancer Unknown   . Cancer Maternal Grandmother   . Heart attack Maternal Grandfather   . Breast cancer Sister     Current Outpatient Medications:  .  buprenorphine (SUBUTEX) 8 MG SUBL SL tablet, place ONE TABLET UNDER THE TONGUE TWICE DAILY, Disp: , Rfl: 0 .  FOLIC ACID PO, Take by mouth daily., Disp: , Rfl:  .  Pediatric Multiple Vit-C-FA (FLINSTONES GUMMIES OMEGA-3 DHA) CHEW, Chew by mouth daily., Disp: , Rfl:  Social History: Reviewed -  reports that she has been smoking cigarettes. She has been smoking about 0.50 packs per day. She has never used smokeless tobacco.   Physical Assessment:   Vitals:   10/20/17 0842  BP: 103/72  Pulse: 63  Weight: 188 lb (85.3 kg)  Body mass index is 29.44 kg/m.           Physical Examination:   General appearance: alert, well appearing, and in no  distress  Mental status: alert, oriented to person, place, and time  Skin: warm & dry   Extremities: Edema: Trace    Cardiovascular: normal heart rate noted  Respiratory: normal respiratory effort, no distress  Abdomen: gravid, soft, non-tender  Pelvic: Cervical exam performed  Dilation: 1 Effacement (%): Thick Station: -1  Fetal Status: Fetal Heart Rate (bpm): 120 Fundal Height: 37 cm Movement: (!) Decreased Presentation: Vertex  Fetal Surveillance Testing today: NST: FHR baseline 140 bpm, Variability: moderate, Accelerations:present, Decelerations:  Absent= Cat 1/Reactive   No results found for this or any previous visit (from the past 24 hour(s)).  Assessment & Plan:  1) High-risk pregnancy G4P3003 at [redacted]w[redacted]d with an Estimated Date of Delivery: 10/28/17   2) Subutex, stable  3) AMA, 37,    Treatment Plan:  Weekly visits  Follow-up: Return in about 1 week (around 10/27/2017) for HROB.  Orders Placed This Encounter  Procedures  . POC Urinalysis Dipstick OB   Jacklyn Shell CNM 10/21/2017 1:23 PM

## 2017-10-26 ENCOUNTER — Encounter: Payer: Self-pay | Admitting: Obstetrics & Gynecology

## 2017-10-26 ENCOUNTER — Ambulatory Visit (INDEPENDENT_AMBULATORY_CARE_PROVIDER_SITE_OTHER): Payer: Medicaid Other | Admitting: Obstetrics & Gynecology

## 2017-10-26 VITALS — BP 129/85 | HR 65 | Wt 185.0 lb

## 2017-10-26 DIAGNOSIS — O0993 Supervision of high risk pregnancy, unspecified, third trimester: Secondary | ICD-10-CM | POA: Diagnosis not present

## 2017-10-26 DIAGNOSIS — O09523 Supervision of elderly multigravida, third trimester: Secondary | ICD-10-CM

## 2017-10-26 DIAGNOSIS — O09522 Supervision of elderly multigravida, second trimester: Secondary | ICD-10-CM

## 2017-10-26 DIAGNOSIS — Z1389 Encounter for screening for other disorder: Secondary | ICD-10-CM

## 2017-10-26 DIAGNOSIS — O283 Abnormal ultrasonic finding on antenatal screening of mother: Secondary | ICD-10-CM | POA: Diagnosis not present

## 2017-10-26 DIAGNOSIS — Z331 Pregnant state, incidental: Secondary | ICD-10-CM

## 2017-10-26 DIAGNOSIS — Z3A39 39 weeks gestation of pregnancy: Secondary | ICD-10-CM

## 2017-10-26 LAB — POCT URINALYSIS DIPSTICK OB
Glucose, UA: NEGATIVE
KETONES UA: NEGATIVE
NITRITE UA: NEGATIVE
PROTEIN: NEGATIVE
RBC UA: NEGATIVE

## 2017-10-26 NOTE — Progress Notes (Signed)
   HIGH-RISK PREGNANCY VISIT Patient name: Natalie Paul MRN [id number]  Date of birth: 03-05-1980 Chief Complaint:   High Risk Gestation  History of Present Illness:   Natalie Paul is a 37 y.o. [phone] female at [redacted]w[redacted]d with an Estimated Date of Delivery: 10/28/17 being seen today for ongoing management of a high-risk pregnancy complicated by elevated T21 risk with normal cfDNA, AMA chronic subutex therapy.  Today she reports no complaints. Contractions: Irregular. Vag. Bleeding: None.  Movement: Present. denies leaking of fluid.  Review of Systems:   Pertinent items are noted in HPI Denies abnormal vaginal discharge w/ itching/odor/irritation, headaches, visual changes, shortness of breath, chest pain, abdominal pain, severe nausea/vomiting, or problems with urination or bowel movements unless otherwise stated above. Pertinent History Reviewed:  Reviewed past medical,surgical, social, obstetrical and family history.  Reviewed problem list, medications and allergies. Physical Assessment:   Vitals:   10/26/17 0910  BP: 129/85  Pulse: 65  Weight: 185 lb (83.9 kg)  Body mass index is 28.98 kg/m.           Physical Examination:   General appearance: alert, well appearing, and in no distress  Mental status: alert, oriented to person, place, and time  Skin: warm & dry   Extremities: Edema: Trace    Cardiovascular: normal heart rate noted  Respiratory: normal respiratory effort, no distress  Abdomen: gravid, soft, non-tender  Pelvic: Cervical exam performed        cx 2/50/-2/soft posterior Cervix is stenotic just on the edge, she had cryotherapy prior to her other kids but you can still feel it Membranes swept  Fetal Status: Fetal Heart Rate (bpm): 120 Fundal Height: 37 cm Movement: Present    Fetal Surveillance Testing today: FHR 120   Results for orders placed or performed in visit on 10/26/17 (from the past 24 hour(s))  POC Urinalysis Dipstick OB   Collection Time: 10/26/17  9:11  AM  Result Value Ref Range   Color, UA     Clarity, UA     Glucose, UA Negative Negative   Bilirubin, UA     Ketones, UA neg    Spec Grav, UA     Blood, UA neg    pH, UA     POC Protein UA Negative Negative, Trace   Urobilinogen, UA     Nitrite, UA neg    Leukocytes, UA Trace (A) Negative   Appearance     Odor      Assessment & Plan:  1) High-risk pregnancy G4P3003 at [redacted]w[redacted]d with an Estimated Date of Delivery: 10/28/17   2) Chronic subutex therapy, stable  3) AMA, age 31 years old  Meds: No orders of the defined types were placed in this encounter.   Labs/procedures today:   Treatment Plan:  Membranes swept  Reviewed: Term labor symptoms and general obstetric precautions including but not limited to vaginal bleeding, contractions, leaking of fluid and fetal movement were reviewed in detail with the patient.  All questions were answered.  Follow-up: Return in about 6 days (around 11/01/2017) for NST, HROB.  Orders Placed This Encounter  Procedures  . POC Urinalysis Dipstick OB   Lazaro Arms CNM, [facility] 10/26/2017 9:55 AM

## 2017-10-27 ENCOUNTER — Other Ambulatory Visit: Payer: Self-pay

## 2017-10-27 ENCOUNTER — Encounter (HOSPITAL_COMMUNITY): Payer: Self-pay

## 2017-10-27 ENCOUNTER — Inpatient Hospital Stay (HOSPITAL_COMMUNITY)
Admission: AD | Admit: 2017-10-27 | Discharge: 2017-10-30 | DRG: 797 | Disposition: A | Discharge status: Home / Self Care | Payer: Medicaid Other | Attending: Obstetrics and Gynecology | Admitting: Obstetrics and Gynecology

## 2017-10-27 DIAGNOSIS — O99334 Smoking (tobacco) complicating childbirth: Secondary | ICD-10-CM | POA: Diagnosis present

## 2017-10-27 DIAGNOSIS — F17210 Nicotine dependence, cigarettes, uncomplicated: Secondary | ICD-10-CM | POA: Diagnosis present

## 2017-10-27 DIAGNOSIS — Z3A40 40 weeks gestation of pregnancy: Secondary | ICD-10-CM | POA: Diagnosis not present

## 2017-10-27 DIAGNOSIS — O99824 Streptococcus B carrier state complicating childbirth: Secondary | ICD-10-CM | POA: Diagnosis present

## 2017-10-27 DIAGNOSIS — O4290 Premature rupture of membranes, unspecified as to length of time between rupture and onset of labor, unspecified weeks of gestation: Secondary | ICD-10-CM | POA: Diagnosis not present

## 2017-10-27 DIAGNOSIS — Z3A39 39 weeks gestation of pregnancy: Secondary | ICD-10-CM

## 2017-10-27 DIAGNOSIS — F1911 Other psychoactive substance abuse, in remission: Secondary | ICD-10-CM

## 2017-10-27 DIAGNOSIS — O4202 Full-term premature rupture of membranes, onset of labor within 24 hours of rupture: Secondary | ICD-10-CM | POA: Diagnosis not present

## 2017-10-27 DIAGNOSIS — O99324 Drug use complicating childbirth: Secondary | ICD-10-CM | POA: Diagnosis present

## 2017-10-27 DIAGNOSIS — F1290 Cannabis use, unspecified, uncomplicated: Secondary | ICD-10-CM | POA: Diagnosis present

## 2017-10-27 DIAGNOSIS — Z302 Encounter for sterilization: Secondary | ICD-10-CM | POA: Diagnosis not present

## 2017-10-27 DIAGNOSIS — O4292 Full-term premature rupture of membranes, unspecified as to length of time between rupture and onset of labor: Secondary | ICD-10-CM | POA: Diagnosis present

## 2017-10-27 DIAGNOSIS — O479 False labor, unspecified: Secondary | ICD-10-CM | POA: Diagnosis present

## 2017-10-27 HISTORY — DX: Other specified health status: Z78.9

## 2017-10-27 LAB — CBC
HEMATOCRIT: 39.1 % (ref 36.0–46.0)
Hemoglobin: 13.9 g/dL (ref 12.0–15.0)
MCH: 32.3 pg (ref 26.0–34.0)
MCHC: 35.5 g/dL (ref 30.0–36.0)
MCV: 90.9 fL (ref 80.0–100.0)
NRBC: 0 % (ref 0.0–0.2)
Platelets: 172 10*3/uL (ref 150–400)
RBC: 4.3 MIL/uL (ref 3.87–5.11)
RDW: 13.6 % (ref 11.5–15.5)
WBC: 17.7 10*3/uL — AB (ref 4.0–10.5)

## 2017-10-27 LAB — TYPE AND SCREEN
ABO/RH(D): A POS
ANTIBODY SCREEN: NEGATIVE

## 2017-10-27 LAB — POCT FERN TEST: POCT Fern Test: POSITIVE

## 2017-10-27 MED ORDER — OXYCODONE-ACETAMINOPHEN 5-325 MG PO TABS: 2.0000 | ORAL_TABLET | ORAL | Status: DC | PRN

## 2017-10-27 MED ORDER — OXYTOCIN 40 UNITS IN LACTATED RINGERS INFUSION - SIMPLE MED
2.5000 [IU]/h | INTRAVENOUS | Status: DC
  Filled 2017-10-27: qty 1000

## 2017-10-27 MED ORDER — LIDOCAINE HCL (PF) 1 % IJ SOLN: 30.0000 mL | INTRAMUSCULAR | Status: DC | PRN

## 2017-10-27 MED ORDER — ONDANSETRON HCL 4 MG/2ML IJ SOLN: 4.0000 mg | Freq: Four times a day (QID) | INTRAMUSCULAR | Status: DC | PRN

## 2017-10-27 MED ORDER — OXYTOCIN BOLUS FROM INFUSION
500.0000 mL | Freq: Once | INTRAVENOUS | Status: AC
  Administered 2017-10-28: 500 mL via INTRAVENOUS

## 2017-10-27 MED ORDER — SODIUM CHLORIDE 0.9 % IV SOLN
2.0000 g | Freq: Once | INTRAVENOUS | Status: AC
  Administered 2017-10-27: 2 g via INTRAVENOUS
  Filled 2017-10-27: qty 2

## 2017-10-27 MED ORDER — OXYCODONE-ACETAMINOPHEN 5-325 MG PO TABS: 1.0000 | ORAL_TABLET | ORAL | Status: DC | PRN

## 2017-10-27 MED ORDER — ACETAMINOPHEN 325 MG PO TABS: 650.0000 mg | ORAL_TABLET | ORAL | Status: DC | PRN

## 2017-10-27 MED ORDER — LACTATED RINGERS IV SOLN: 500.0000 mL | INTRAVENOUS | Status: DC | PRN

## 2017-10-27 MED ORDER — LACTATED RINGERS IV SOLN
INTRAVENOUS | Status: DC
  Administered 2017-10-27 – 2017-10-28 (×4): via INTRAVENOUS

## 2017-10-27 MED ORDER — SOD CITRATE-CITRIC ACID 500-334 MG/5ML PO SOLN
30.0000 mL | ORAL | Status: DC | PRN
  Administered 2017-10-28: 30 mL via ORAL
  Filled 2017-10-27: qty 15

## 2017-10-27 NOTE — H&P (Addendum)
LABOR AND DELIVERY ADMISSION HISTORY AND PHYSICAL NOTE  Natalie Paul is a 37 y.o. female [phone] with IUP at [redacted]w[redacted]d by LMP c/w 1st trimester [place] @12wks  presenting for PROM.  She reports positive fetal movement. She denies leakage of fluid or vaginal bleeding.  Prenatal History/Complications: PNC at [facility] Pregnancy complications:  - h/o substance use on subutex 8mg  bid  - tobacco use (1ppd)  - marijuana use - abnormal chromosomal and genetic finding on antenatal screening  - AMA  - increased nuchal translucency space on fetal US  Sono: @[redacted]w[redacted]d , CWD, female, normal anatomy, cephalic presentation, anterior/grade 2 placental lie, 2869g, 38% EFW  Past Medical History: Past Medical History:  Diagnosis Date  . Substance abuse [facility])     Past Surgical History: Past Surgical History:  Procedure Laterality Date  . FRACTURE SURGERY      Obstetrical History: OB History    Gravida  4   Para  3   Term  3   Preterm      AB      Living  3     SAB      TAB      Ectopic      Multiple      Live Births  3           Social History: Social History   Socioeconomic History  . Marital status: Single    Spouse name: Not on file  . Number of children: Not on file  . Years of education: Not on file  . Highest education level: Not on file  Occupational History  . Not on file  Social Needs  . Financial resource strain: Not on file  . Food insecurity:    Worry: Not on file    Inability: Not on file  . Transportation needs:    Medical: Not on file    Non-medical: Not on file  Tobacco Use  . Smoking status: Current Every Day Smoker    Packs/day: 0.50    Types: Cigarettes  . Smokeless tobacco: Never Used  Substance and Sexual Activity  . Alcohol use: Yes    Frequency: Never    Comment: early pregnancy  . Drug use: Not Currently    Types: Oxycodone, Hydrocodone, Marijuana    Comment: subutex  . Sexual activity: Yes    Birth control/protection: None  Lifestyle  .  Physical activity:    Days per week: Not on file    Minutes per session: Not on file  . Stress: Not on file  Relationships  . Social connections:    Talks on phone: Not on file    Gets together: Not on file    Attends religious service: Not on file    Active member of club or organization: Not on file    Attends meetings of clubs or organizations: Not on file    Relationship status: Not on file  Other Topics Concern  . Not on file  Social History Narrative  . Not on file    Family History: Family History  Problem Relation Age of Onset  . Cancer Unknown   . Cancer Maternal Grandmother   . Heart attack Maternal Grandfather   . Breast cancer Sister     Allergies: No Known Allergies  Medications Prior to Admission  Medication Sig Dispense Refill Last Dose  . buprenorphine (SUBUTEX) 8 MG SUBL SL tablet place ONE TABLET UNDER THE TONGUE TWICE DAILY  0 10/27/2017 at 1830  . FOLIC ACID PO Take  by mouth daily.   10/27/2017 at Unknown time  . Pediatric Multiple Vit-C-FA (FLINSTONES GUMMIES OMEGA-3 DHA) CHEW Chew by mouth daily.   10/27/2017 at Unknown time     Review of Systems  All systems reviewed and negative except as stated in HPI  Physical Exam Blood pressure 128/83, pulse 95, temperature 98.2 F (36.8 C), temperature source Oral, resp. rate 18, height 5\' 7"  (1.702 m), weight 83 kg, last menstrual period 01/21/2017, SpO2 97 %. General appearance: alert, oriented, NAD Lungs: normal respiratory effort Heart: regular rate Abdomen: soft, non-tender; gravid, FH appropriate for GA Extremities: No calf swelling or tenderness Presentation: cephalic (confirmed via [place]) Fetal monitoring: baseline 150, moderate variability, +accel, 1 variable decel Uterine activity: irregular     Prenatal labs: ABO, Rh: A/Positive/-- (04/16 1454) Antibody: Negative (07/11 0916) Rubella: 1.34 (04/16 1454) RPR: Non Reactive (07/11 0916)  HBsAg: Negative (04/16 1454)  HIV: Non Reactive (07/11  0916)  GC/Chlamydia: Negative (10/06/17) GBS: Positive (09/18 1330)  2-hr GTT: 78/138/78 Genetic screening:  Integrated screen positive, referred to genetic counselor  Anatomy US: normal   Prenatal Transfer Tool  Maternal Diabetes: No Genetic Screening: Abnormal:  Results: Elevated risk of Trisomy 21 Maternal Ultrasounds/Referrals: Normal Fetal Ultrasounds or other Referrals:  Fetal echo Maternal Substance Abuse:  Yes:  Type: Smoker, Other: subutex Significant Maternal Medications:  Meds include: Other: subutex Significant Maternal Lab Results: Lab values include: Group B Strep positive  Results for orders placed or performed during the hospital encounter of 10/27/17 (from the past 24 hour(s))  POCT fern test   Collection Time: 10/27/17 10:18 PM  Result Value Ref Range   POCT Fern Test Positive = ruptured amniotic membanes     Patient Active Problem List   Diagnosis Date Noted  . AMA (advanced maternal age) multigravida 35+, second trimester   . Increased nuchal translucency space on fetal ultrasound   . Abnormal chromosomal and genetic finding on antenatal screening mother 05/06/2017  . Marijuana use 05/05/2017  . Supervision of normal pregnancy 05/04/2017  . Pregnancy complicated by subutex maintenance, antepartum (HCC) 05/04/2017  . Smoker 05/04/2017  . History of substance abuse (HCC) 03/31/2017  . Metacarpal bone fracture 06/05/2010    Assessment: Natalie Paul is a 37 y.o. [phone] at [redacted]w[redacted]d here for PROM  #Labor: expectant management, can consider cytotec/FB/pitocin as labor progresses  #Pain: Per patient request, wants epidural  #FWB: Cat 2  #ID:  GBS positive - ampicillin  #MOF: bottle #MOC:BTL #Circ:  Outpatient   Oralia Manis, DO PGY-2 10/27/2017, 10:59 PM I confirm that I have verified the information documented in the resident's note and that I have also personally reperformed the physical exam and all medical decision making activities. The patient was  seen and examined by me also Agree with note NST reactive and reassuring UCs as listed Cervical exams as listed in note Cervix was 2/50 in office Will start with Cytotec and move on to Pitocin if needed. Consider AROM   Aviva Signs, CNM

## 2017-10-27 NOTE — MAU Note (Signed)
Pt reports leaking clear fluid, started around 6pm. Pt denies contractions or pain. Denies vaginal bleeding. Reports good fetal movement. Cervix was 1cm yesterday and had membrane swept.

## 2017-10-28 ENCOUNTER — Encounter (HOSPITAL_COMMUNITY): Payer: Self-pay

## 2017-10-28 ENCOUNTER — Encounter (HOSPITAL_COMMUNITY): Payer: Self-pay | Admitting: Anesthesiology

## 2017-10-28 ENCOUNTER — Inpatient Hospital Stay (HOSPITAL_COMMUNITY): Payer: Medicaid Other | Admitting: Anesthesiology

## 2017-10-28 ENCOUNTER — Telehealth: Payer: Self-pay | Admitting: *Deleted

## 2017-10-28 ENCOUNTER — Encounter (HOSPITAL_COMMUNITY)
Admission: AD | Disposition: A | Discharge status: Home / Self Care | Payer: Self-pay | Source: Home / Self Care | Attending: Obstetrics and Gynecology

## 2017-10-28 DIAGNOSIS — O4202 Full-term premature rupture of membranes, onset of labor within 24 hours of rupture: Secondary | ICD-10-CM

## 2017-10-28 DIAGNOSIS — Z3A40 40 weeks gestation of pregnancy: Secondary | ICD-10-CM

## 2017-10-28 DIAGNOSIS — Z302 Encounter for sterilization: Secondary | ICD-10-CM

## 2017-10-28 HISTORY — PX: TUBAL LIGATION: SHX77

## 2017-10-28 LAB — RAPID URINE DRUG SCREEN, HOSP PERFORMED
AMPHETAMINES: NOT DETECTED
BENZODIAZEPINES: NOT DETECTED
Barbiturates: NOT DETECTED
COCAINE: NOT DETECTED
Opiates: NOT DETECTED
TETRAHYDROCANNABINOL: NOT DETECTED

## 2017-10-28 LAB — RPR: RPR Ser Ql: NONREACTIVE

## 2017-10-28 LAB — ABO/RH: ABO/RH(D): A POS

## 2017-10-28 LAB — HIV ANTIBODY (ROUTINE TESTING W REFLEX): HIV Screen 4th Generation wRfx: NONREACTIVE

## 2017-10-28 SURGERY — LIGATION, FALLOPIAN TUBE, POSTPARTUM
Anesthesia: Choice | Site: Abdomen | Laterality: Bilateral | Wound class: Clean Contaminated

## 2017-10-28 MED ORDER — KETOROLAC TROMETHAMINE 30 MG/ML IJ SOLN
INTRAMUSCULAR | Status: AC
  Filled 2017-10-28: qty 1

## 2017-10-28 MED ORDER — TERBUTALINE SULFATE 1 MG/ML IJ SOLN: 0.2500 mg | Freq: Once | INTRAMUSCULAR | Status: DC | PRN

## 2017-10-28 MED ORDER — PROPOFOL 10 MG/ML IV BOLUS
INTRAVENOUS | Status: AC
  Filled 2017-10-28: qty 20

## 2017-10-28 MED ORDER — LIDOCAINE HCL (PF) 1 % IJ SOLN
INTRAMUSCULAR | Status: DC | PRN
  Administered 2017-10-28: 10 mL
  Administered 2017-10-28 (×2): 5 mL via EPIDURAL

## 2017-10-28 MED ORDER — DIPHENHYDRAMINE HCL 50 MG/ML IJ SOLN: 12.5000 mg | INTRAMUSCULAR | Status: DC | PRN

## 2017-10-28 MED ORDER — ONDANSETRON HCL 4 MG/2ML IJ SOLN
INTRAMUSCULAR | Status: DC | PRN
  Administered 2017-10-28: 4 mg via INTRAVENOUS

## 2017-10-28 MED ORDER — BUPIVACAINE HCL (PF) 0.25 % IJ SOLN
INTRAMUSCULAR | Status: AC
  Filled 2017-10-28: qty 30

## 2017-10-28 MED ORDER — ONDANSETRON HCL 4 MG/2ML IJ SOLN
INTRAMUSCULAR | Status: AC
  Filled 2017-10-28: qty 2

## 2017-10-28 MED ORDER — MISOPROSTOL 50MCG HALF TABLET
50.0000 ug | ORAL_TABLET | ORAL | Status: DC | PRN
  Administered 2017-10-28: 50 ug via ORAL
  Filled 2017-10-28 (×2): qty 1

## 2017-10-28 MED ORDER — PHENYLEPHRINE 40 MCG/ML (10ML) SYRINGE FOR IV PUSH (FOR BLOOD PRESSURE SUPPORT): 80.0000 ug | PREFILLED_SYRINGE | INTRAVENOUS | Status: DC | PRN

## 2017-10-28 MED ORDER — BUPIVACAINE IN DEXTROSE 0.75-8.25 % IT SOLN
INTRATHECAL | Status: DC | PRN
  Administered 2017-10-28: 1 mL via INTRATHECAL

## 2017-10-28 MED ORDER — MIDAZOLAM HCL 5 MG/5ML IJ SOLN
INTRAMUSCULAR | Status: DC | PRN
  Administered 2017-10-28: 2 mg via INTRAVENOUS

## 2017-10-28 MED ORDER — OXYCODONE HCL 5 MG PO TABS: 5.0000 mg | ORAL_TABLET | Freq: Once | ORAL | Status: DC | PRN

## 2017-10-28 MED ORDER — IBUPROFEN 600 MG PO TABS
600.0000 mg | ORAL_TABLET | Freq: Four times a day (QID) | ORAL | Status: DC
  Administered 2017-10-28 – 2017-10-30 (×6): 600 mg via ORAL
  Filled 2017-10-28 (×6): qty 1

## 2017-10-28 MED ORDER — DIPHENHYDRAMINE HCL 25 MG PO CAPS: 25.0000 mg | ORAL_CAPSULE | Freq: Four times a day (QID) | ORAL | Status: DC | PRN

## 2017-10-28 MED ORDER — FENTANYL CITRATE (PF) 100 MCG/2ML IJ SOLN: 100.0000 ug | INTRAMUSCULAR | Status: DC | PRN

## 2017-10-28 MED ORDER — FENTANYL CITRATE (PF) 100 MCG/2ML IJ SOLN: 50.0000 ug | INTRAMUSCULAR | Status: DC | PRN

## 2017-10-28 MED ORDER — BENZOCAINE-MENTHOL 20-0.5 % EX AERO: 1.0000 "application " | INHALATION_SPRAY | CUTANEOUS | Status: DC | PRN

## 2017-10-28 MED ORDER — TETANUS-DIPHTH-ACELL PERTUSSIS 5-2.5-18.5 LF-MCG/0.5 IM SUSP: 0.5000 mL | Freq: Once | INTRAMUSCULAR | Status: DC

## 2017-10-28 MED ORDER — ZOLPIDEM TARTRATE 5 MG PO TABS: 5.0000 mg | ORAL_TABLET | Freq: Every evening | ORAL | Status: DC | PRN

## 2017-10-28 MED ORDER — EPHEDRINE 5 MG/ML INJ: 10.0000 mg | INTRAVENOUS | Status: DC | PRN

## 2017-10-28 MED ORDER — PENICILLIN G 3 MILLION UNITS IVPB - SIMPLE MED: 3.0000 10*6.[IU] | INTRAVENOUS | Status: DC

## 2017-10-28 MED ORDER — DIBUCAINE 1 % RE OINT: 1.0000 "application " | TOPICAL_OINTMENT | RECTAL | Status: DC | PRN

## 2017-10-28 MED ORDER — ACETAMINOPHEN 325 MG PO TABS
650.0000 mg | ORAL_TABLET | ORAL | Status: DC | PRN
  Administered 2017-10-29: 650 mg via ORAL
  Filled 2017-10-28: qty 2

## 2017-10-28 MED ORDER — SODIUM CHLORIDE 0.9 % IR SOLN
Status: DC | PRN
  Administered 2017-10-28: 1

## 2017-10-28 MED ORDER — FENTANYL CITRATE (PF) 100 MCG/2ML IJ SOLN
INTRAMUSCULAR | Status: AC
  Filled 2017-10-28: qty 2

## 2017-10-28 MED ORDER — FENTANYL 2.5 MCG/ML BUPIVACAINE 1/10 % EPIDURAL INFUSION (WH - ANES)
14.0000 mL/h | INTRAMUSCULAR | Status: DC | PRN
  Administered 2017-10-28: 14 mL/h via EPIDURAL
  Filled 2017-10-28: qty 100

## 2017-10-28 MED ORDER — OXYCODONE HCL 5 MG/5ML PO SOLN: 5.0000 mg | Freq: Once | ORAL | Status: DC | PRN

## 2017-10-28 MED ORDER — FENTANYL CITRATE (PF) 100 MCG/2ML IJ SOLN
INTRAMUSCULAR | Status: DC | PRN
  Administered 2017-10-28 (×2): 100 ug via INTRAVENOUS

## 2017-10-28 MED ORDER — BUPIVACAINE HCL (PF) 0.25 % IJ SOLN: INTRAMUSCULAR | Status: DC | PRN

## 2017-10-28 MED ORDER — MEASLES, MUMPS & RUBELLA VAC ~~LOC~~ INJ: 0.5000 mL | INJECTION | Freq: Once | SUBCUTANEOUS | Status: DC

## 2017-10-28 MED ORDER — BUPIVACAINE-EPINEPHRINE (PF) 0.25% -1:200000 IJ SOLN
INTRAMUSCULAR | Status: DC | PRN
  Administered 2017-10-28 (×2): 5 mL

## 2017-10-28 MED ORDER — LACTATED RINGERS IV SOLN
500.0000 mL | Freq: Once | INTRAVENOUS | Status: AC
  Administered 2017-10-28: 500 mL via INTRAVENOUS

## 2017-10-28 MED ORDER — OXYTOCIN 40 UNITS IN LACTATED RINGERS INFUSION - SIMPLE MED
1.0000 m[IU]/min | INTRAVENOUS | Status: DC
  Administered 2017-10-28: 2 m[IU]/min via INTRAVENOUS

## 2017-10-28 MED ORDER — SENNOSIDES-DOCUSATE SODIUM 8.6-50 MG PO TABS
2.0000 | ORAL_TABLET | ORAL | Status: DC
  Administered 2017-10-28 – 2017-10-29 (×2): 2 via ORAL
  Filled 2017-10-28 (×2): qty 2

## 2017-10-28 MED ORDER — ONDANSETRON HCL 4 MG PO TABS: 4.0000 mg | ORAL_TABLET | ORAL | Status: DC | PRN

## 2017-10-28 MED ORDER — BUPIVACAINE HCL (PF) 0.25 % IJ SOLN
INTRAMUSCULAR | Status: DC | PRN
  Administered 2017-10-28: 10 mL

## 2017-10-28 MED ORDER — PRENATAL MULTIVITAMIN CH
1.0000 | ORAL_TABLET | Freq: Every day | ORAL | Status: DC
  Administered 2017-10-29: 1 via ORAL
  Filled 2017-10-28: qty 1

## 2017-10-28 MED ORDER — DEXAMETHASONE SODIUM PHOSPHATE 4 MG/ML IJ SOLN
INTRAMUSCULAR | Status: DC | PRN
  Administered 2017-10-28: 4 mg via INTRAVENOUS

## 2017-10-28 MED ORDER — PROPOFOL 10 MG/ML IV BOLUS
INTRAVENOUS | Status: DC | PRN
  Administered 2017-10-28 (×3): 20 mg via INTRAVENOUS

## 2017-10-28 MED ORDER — COCONUT OIL OIL: 1.0000 "application " | TOPICAL_OIL | Status: DC | PRN

## 2017-10-28 MED ORDER — SIMETHICONE 80 MG PO CHEW: 80.0000 mg | CHEWABLE_TABLET | ORAL | Status: DC | PRN

## 2017-10-28 MED ORDER — WITCH HAZEL-GLYCERIN EX PADS: 1.0000 "application " | MEDICATED_PAD | CUTANEOUS | Status: DC | PRN

## 2017-10-28 MED ORDER — SODIUM CHLORIDE 0.9 % IV SOLN
2.0000 g | Freq: Four times a day (QID) | INTRAVENOUS | Status: DC
  Administered 2017-10-28: 2 g via INTRAVENOUS
  Filled 2017-10-28: qty 2000
  Filled 2017-10-28: qty 2

## 2017-10-28 MED ORDER — DEXAMETHASONE SODIUM PHOSPHATE 4 MG/ML IJ SOLN
INTRAMUSCULAR | Status: AC
  Filled 2017-10-28: qty 1

## 2017-10-28 MED ORDER — ONDANSETRON HCL 4 MG/2ML IJ SOLN: 4.0000 mg | INTRAMUSCULAR | Status: DC | PRN

## 2017-10-28 MED ORDER — SODIUM CHLORIDE 0.9 % IV SOLN
2.0000 g | Freq: Four times a day (QID) | INTRAVENOUS | Status: DC
  Filled 2017-10-28 (×2): qty 2000

## 2017-10-28 MED ORDER — MIDAZOLAM HCL 2 MG/2ML IJ SOLN
INTRAMUSCULAR | Status: AC
  Filled 2017-10-28: qty 2

## 2017-10-28 MED ORDER — KETOROLAC TROMETHAMINE 30 MG/ML IJ SOLN
INTRAMUSCULAR | Status: DC | PRN
  Administered 2017-10-28: 30 mg via INTRAVENOUS

## 2017-10-28 MED ORDER — SODIUM CHLORIDE 0.9 % IV SOLN
5.0000 10*6.[IU] | Freq: Once | INTRAVENOUS | Status: AC
  Administered 2017-10-28: 5 10*6.[IU] via INTRAVENOUS
  Filled 2017-10-28: qty 5

## 2017-10-28 SURGICAL SUPPLY — 23 items
CLIP FILSHIE TUBAL LIGA STRL (Clip) ×3 IMPLANT
CLOTH BEACON ORANGE TIMEOUT ST (SAFETY) ×3 IMPLANT
DRESSING OPSITE X SMALL 2X3 (GAUZE/BANDAGES/DRESSINGS) ×2 IMPLANT
DRSG OPSITE POSTOP 3X4 (GAUZE/BANDAGES/DRESSINGS) ×3 IMPLANT
DURAPREP 26ML APPLICATOR (WOUND CARE) ×3 IMPLANT
GLOVE BIO SURGEON STRL SZ7.5 (GLOVE) ×3 IMPLANT
GLOVE BIOGEL PI IND STRL 7.0 (GLOVE) ×2 IMPLANT
GLOVE BIOGEL PI INDICATOR 7.0 (GLOVE) ×4
GOWN STRL REUS W/TWL LRG LVL3 (GOWN DISPOSABLE) ×3 IMPLANT
GOWN STRL REUS W/TWL XL LVL3 (GOWN DISPOSABLE) ×3 IMPLANT
NEEDLE HYPO 22GX1.5 SAFETY (NEEDLE) ×5 IMPLANT
NS IRRIG 1000ML POUR BTL (IV SOLUTION) ×3 IMPLANT
PACK ABDOMINAL MINOR (CUSTOM PROCEDURE TRAY) ×3 IMPLANT
PROTECTOR NERVE ULNAR (MISCELLANEOUS) ×3 IMPLANT
SPONGE LAP 4X18 RFD (DISPOSABLE) IMPLANT
SUT MNCRL AB 4-0 PS2 18 (SUTURE) ×3 IMPLANT
SUT PLAIN 0 NONE (SUTURE) ×3 IMPLANT
SUT VIC AB 0 CT1 27 (SUTURE) ×3
SUT VIC AB 0 CT1 27XBRD ANBCTR (SUTURE) ×1 IMPLANT
SYR CONTROL 10ML LL (SYRINGE) ×5 IMPLANT
TOWEL OR 17X24 6PK STRL BLUE (TOWEL DISPOSABLE) ×6 IMPLANT
TRAY FOLEY CATH SILVER 14FR (SET/KITS/TRAYS/PACK) ×3 IMPLANT
WATER STERILE IRR 1000ML POUR (IV SOLUTION) ×3 IMPLANT

## 2017-10-28 NOTE — Anesthesia Postprocedure Evaluation (Signed)
Anesthesia Post Note  Patient: Natalie Paul  Procedure(s) Performed: POST PARTUM TUBAL LIGATION (Bilateral Abdomen)     Patient location during evaluation: PACU Anesthesia Type: Spinal Level of consciousness: oriented and awake and alert Pain management: pain level controlled Vital Signs Assessment: post-procedure vital signs reviewed and stable Respiratory status: spontaneous breathing and respiratory function stable Cardiovascular status: blood pressure returned to baseline and stable Postop Assessment: no headache, no backache, no apparent nausea or vomiting and spinal receding Anesthetic complications: no    Last Vitals:  Vitals:   10/28/17 1715 10/28/17 1730  BP: (!) 144/87 (!) 142/90  Pulse: 66   Resp: 15 15  Temp: (!) 35.9 C 36.6 C  SpO2:      Last Pain:  Vitals:   10/28/17 1730  TempSrc:   PainSc: 4    Pain Goal:                 Lucretia Kern

## 2017-10-28 NOTE — Anesthesia Procedure Notes (Signed)
Epidural Patient location during procedure: OB  Staffing Anesthesiologist: Glynis Hunsucker, MD Performed: anesthesiologist   Preanesthetic Checklist Completed: patient identified, site marked, surgical consent, pre-op evaluation, timeout performed, IV checked, risks and benefits discussed and monitors and equipment checked  Epidural Patient position: sitting Prep: DuraPrep Patient monitoring: heart rate, continuous pulse ox and blood pressure Approach: right paramedian Location: L3-L4 Injection technique: LOR saline  Needle:  Needle type: Tuohy  Needle gauge: 17 G Needle length: 9 cm and 9 Needle insertion depth: 6 cm Catheter type: closed end flexible Catheter size: 20 Guage Catheter at skin depth: 10 cm Test dose: negative  Assessment Events: blood not aspirated, injection not painful, no injection resistance, negative IV test and no paresthesia  Additional Notes Patient identified. Risks/Benefits/Options discussed with patient including but not limited to bleeding, infection, nerve damage, paralysis, failed block, incomplete pain control, headache, blood pressure changes, nausea, vomiting, reactions to medication both or allergic, itching and postpartum back pain. Confirmed with bedside nurse the patient's most recent platelet count. Confirmed with patient that they are not currently taking any anticoagulation, have any bleeding history or any family history of bleeding disorders. Patient expressed understanding and wished to proceed. All questions were answered. Sterile technique was used throughout the entire procedure. Please see nursing notes for vital signs. Test dose was given through epidural needle and negative prior to continuing to dose epidural or start infusion. Warning signs of high block given to the patient including shortness of breath, tingling/numbness in hands, complete motor block, or any concerning symptoms with instructions to call for help. Patient was given  instructions on fall risk and not to get out of bed. All questions and concerns addressed with instructions to call with any issues.     

## 2017-10-28 NOTE — Anesthesia Preprocedure Evaluation (Signed)
Anesthesia Evaluation  Patient identified by MRN, date of birth, ID band Patient awake    Reviewed: Allergy & Precautions, H&P , NPO status , Patient's Chart, lab work & pertinent test results  History of Anesthesia Complications Negative for: history of anesthetic complications  Airway Mallampati: II  TM Distance: >3 FB Neck ROM: full    Dental no notable dental hx. (+) Teeth Intact   Pulmonary neg pulmonary ROS, Current Smoker,    Pulmonary exam normal breath sounds clear to auscultation       Cardiovascular negative cardio ROS Normal cardiovascular exam Rhythm:regular Rate:Normal     Neuro/Psych negative neurological ROS  negative psych ROS   GI/Hepatic negative GI ROS, (+)     substance abuse (on subutex)  marijuana use,   Endo/Other  negative endocrine ROS  Renal/GU negative Renal ROS  negative genitourinary   Musculoskeletal  (+) narcotic dependent  Abdominal   Peds  Hematology negative hematology ROS (+)   Anesthesia Other Findings   Reproductive/Obstetrics (+) Pregnancy                             Anesthesia Physical Anesthesia Plan  ASA: II  Anesthesia Plan: Epidural   Post-op Pain Management:    Induction:   PONV Risk Score and Plan:   Airway Management Planned:   Additional Equipment:   Intra-op Plan:   Post-operative Plan:   Informed Consent: I have reviewed the patients History and Physical, chart, labs and discussed the procedure including the risks, benefits and alternatives for the proposed anesthesia with the patient or authorized representative who has indicated his/her understanding and acceptance.     Plan Discussed with:   Anesthesia Plan Comments: (Opioid antagonism discussed with subutex. Informed of likelihood of less effective analgesia)        Anesthesia Quick Evaluation

## 2017-10-28 NOTE — Telephone Encounter (Signed)
Lmom for pt to call us back.  10-28-17  AS

## 2017-10-28 NOTE — Progress Notes (Signed)
Patient desires permanent sterilization.  Other reversible forms of contraception were discussed with patient; she declines all other modalities. Risks of procedure discussed with patient including but not limited to: risk of regret, permanence of method, bleeding, infection, injury to surrounding organs and need for additional procedures.  Failure risk of 1-2 % with increased risk of ectopic gestation if pregnancy occurs was also discussed with patient.  Patient verbalized understanding of these risks and wants to proceed with sterilization.  Written informed consent obtained.  To OR when ready.  

## 2017-10-28 NOTE — Progress Notes (Signed)
LABOR PROGRESS NOTE  BRIEANN OSINSKI is a 37 y.o. [phone] at [redacted]w[redacted]d  admitted for PROM  Subjective: Doing well. Feeling contractions more. Agreeable to FB placement   Objective: BP 128/83 (BP Location: Right Arm)   Pulse 95   Temp 98.2 F (36.8 C) (Oral)   Resp 18   Ht 5\' 7"  (1.702 m)   Wt 83 kg   LMP 01/21/2017   SpO2 97%   BMI 28.66 kg/m  or  Vitals:   10/27/17 2148  BP: 128/83  Pulse: 95  Resp: 18  Temp: 98.2 F (36.8 C)  TempSrc: Oral  SpO2: 97%  Weight: 83 kg  Height: 5\' 7"  (1.702 m)    Dilation: 1.5 Effacement (%): 60 Cervical Position: Middle Station: -3 Presentation: Vertex Exam by:: Williams CNM FHT: baseline rate 120, moderate= varibility, +acel, -decel Toco: irregular, every 2-3 min  Labs: Lab Results  Component Value Date   WBC 17.7 (H) 10/27/2017   HGB 13.9 10/27/2017   HCT 39.1 10/27/2017   MCV 90.9 10/27/2017   PLT 172 10/27/2017    Patient Active Problem List   Diagnosis Date Noted  . Uterine contractions during pregnancy 10/27/2017  . AMA (advanced maternal age) multigravida 35+, second trimester   . Increased nuchal translucency space on fetal ultrasound   . Abnormal chromosomal and genetic finding on antenatal screening mother 05/06/2017  . Marijuana use 05/05/2017  . Supervision of normal pregnancy 05/04/2017  . Pregnancy complicated by subutex maintenance, antepartum (HCC) 05/04/2017  . Smoker 05/04/2017  . History of substance abuse (HCC) 03/31/2017  . Metacarpal bone fracture 06/05/2010    Assessment / Plan: 37 y.o. G4P3003 at [redacted]w[redacted]d here for IOL for PROM   Labor: s/p cytotec x1. Attempted FB placement without success. Called Wynelle Bourgeois, CNM to room who also was unable to place FB. Will plan to see if patient can change on her own.  Fetal Wellbeing:  Cat 1 Pain Control:  Epidural requested  Anticipated MOD:  SVD  Oralia Manis, DO PGY-2 10/28/2017, 4:08 AM

## 2017-10-28 NOTE — Transfer of Care (Signed)
Immediate Anesthesia Transfer of Care Note  Patient: Natalie Paul  Procedure(s) Performed: POST PARTUM TUBAL LIGATION (Bilateral Abdomen)  Patient Location: PACU  Anesthesia Type:Spinal  Level of Consciousness: awake, alert  and oriented  Airway & Oxygen Therapy: Patient Spontanous Breathing  Post-op Assessment: Report given to RN and Post -op Vital signs reviewed and stable  Post vital signs: Reviewed and stable  Last Vitals:  Vitals Value Taken Time  BP    Temp    Pulse 60 10/28/2017  4:46 PM  Resp 15 10/28/2017  4:46 PM  SpO2 99 % 10/28/2017  4:46 PM  Vitals shown include unvalidated device data.  Last Pain:  Vitals:   10/28/17 1200  TempSrc: Oral  PainSc:          Complications: No apparent anesthesia complications

## 2017-10-28 NOTE — Progress Notes (Signed)
Admission nutrition screen triggered for unintentional weight loss > 10 lbs within the last month. Patients chart reviewed and assessed  for nutritional risk.  Pt has experienced a weight loss of 14 lbs since initial PNV at 14 weeks. There is no mention of n/v or poor appetite in PNR .  If poor appetite is noted after delivery, order Ensure Enlive BID  Elisabeth Cara M.Odis Luster LDN Neonatal Nutrition Support Specialist/RD III Pager [phone]      Phone [phone]

## 2017-10-28 NOTE — Progress Notes (Signed)
LABOR PROGRESS NOTE  Natalie Paul is a 37 y.o. [phone] at [redacted]w[redacted]d  admitted for PROM. History complicated by Subutex, tobacco use, and marijuana use.   Subjective: Epidural in place, but very uncomfortable.   Objective: BP (!) 124/95   Pulse 75   Temp (!) 97.5 F (36.4 C) (Oral)   Resp 19   Ht 5\' 7"  (1.702 m)   Wt 83 kg   LMP 01/21/2017   SpO2 98%   BMI 28.66 kg/m  or  Vitals:   10/28/17 0700 10/28/17 0801 10/28/17 0832 10/28/17 0902  BP: 137/90 117/84 135/84 (!) 124/95  Pulse: (!) 57 73 75 75  Resp: 18 16 17 19   Temp:      TempSrc:      SpO2: 100% 98%    Weight:      Height:        Dilation: 4 Effacement (%): 80 Cervical Position: Posterior Station: -1 Presentation: Vertex Exam by:: [employer] RN FHT: baseline rate 115, moderate varibility, +acel, no decel Toco: q2-5 min   Labs: Lab Results  Component Value Date   WBC 17.7 (H) 10/27/2017   HGB 13.9 10/27/2017   HCT 39.1 10/27/2017   MCV 90.9 10/27/2017   PLT 172 10/27/2017    Patient Active Problem List   Diagnosis Date Noted  . Uterine contractions during pregnancy 10/27/2017  . AMA (advanced maternal age) multigravida 35+, second trimester   . Increased nuchal translucency space on fetal ultrasound   . Abnormal chromosomal and genetic finding on antenatal screening mother 05/06/2017  . Marijuana use 05/05/2017  . Supervision of normal pregnancy 05/04/2017  . Pregnancy complicated by subutex maintenance, antepartum (HCC) 05/04/2017  . Smoker 05/04/2017  . History of substance abuse (HCC) 03/31/2017  . Metacarpal bone fracture 06/05/2010    Assessment / Plan: 37 y.o. G4P3003 at [redacted]w[redacted]d here for PROM.   Labor: Contractions have spaced out with only minimal cervical change. Patient agreeable to augmentation with Pitocin. Start Pit 2x2.  Fetal Wellbeing:  Cat I  Pain Control:  Epidural in place. Anesthesia to return to room to re-evaluate.  Anticipated MOD:  NSVD.   Marcy Siren, D.O. OB Fellow   10/28/2017, 9:34 AM

## 2017-10-28 NOTE — Anesthesia Procedure Notes (Signed)
Spinal  Patient location during procedure: OR Staffing Anesthesiologist: Skarlet Lyons E, MD Performed: anesthesiologist  Preanesthetic Checklist Completed: patient identified, surgical consent, pre-op evaluation, timeout performed, IV checked, risks and benefits discussed and monitors and equipment checked Spinal Block Patient position: sitting Prep: site prepped and draped and DuraPrep Patient monitoring: continuous pulse ox, blood pressure and heart rate Approach: midline Location: L3-4 Injection technique: single-shot Needle Needle type: Pencan  Needle gauge: 24 G Needle length: 9 cm Additional Notes Functioning IV was confirmed and monitors were applied. Sterile prep and drape, including hand hygiene and sterile gloves were used. The patient was positioned and the spine was prepped. The skin was anesthetized with lidocaine.  Free flow of clear CSF was obtained prior to injecting local anesthetic into the CSF. The needle was carefully withdrawn. The patient tolerated the procedure well.      

## 2017-10-28 NOTE — Op Note (Signed)
Natalie Paul 10/28/2017  PREOPERATIVE DIAGNOSIS:  Undesired fertility  POSTOPERATIVE DIAGNOSIS:  Undesired fertility  PROCEDURE:  Postpartum Bilateral Tubal Sterilization using Filshie Clips   SURGEON: Surgeon(s) and Role:    * Hermina Staggers, MD - Primary    * Arvilla Market, DO - OB Fellow  ANESTHESIA: Spinal   COMPLICATIONS:  None immediate.  ESTIMATED BLOOD LOSS:  160 mL  FLUIDS: 1000 cc LR.  URINE OUTPUT:  350 cc of clear urine.  INDICATIONS: 37 y.o. yo [id number]  with undesired fertility,status post vaginal delivery, desires permanent sterilization. Risks and benefits of procedure discussed with patient including permanence of method, bleeding, infection, injury to surrounding organs and need for additional procedures. Risk failure of 0.5-1% with increased risk of ectopic gestation if pregnancy occurs was also discussed with patient.   FINDINGS:  Normal uterus, tubes, and ovaries.  TECHNIQUE:  The patient was taken to the operating room where her epidural anesthesia was dosed up to surgical level and found to be adequate.  She was then placed in the dorsal supine position and prepped and draped in sterile fashion.  After an adequate timeout was performed, attention was turned to the patient's abdomen where a small transverse skin incision was made under the umbilical fold. The incision was taken down to the layer of fascia using the scalpel, and fascia was incised, and extended bilaterally using Mayo scissors. The peritoneum was entered in a sharp fashion. Attention was then turned to the patient's uterus, and right fallopian tube was identified and followed out to the fimbriated end.  A Filshie clip was placed on the right fallopian tube about 2 cm from the cornual attachment, with care given to incorporate the underlying mesosalpinx.  A similar process was carried out on the left side allowing for bilateral tubal sterilization.  Good hemostasis was noted overall. The  instruments were then removed from the patient's abdomen and the fascial incision was repaired with 0 Vicryl, and the skin was closed with a 3-0 Monocryl subcuticular stitch. The patient tolerated the procedure well.  Sponge, lap, and needle counts were correct times two.  The patient was then taken to the recovery room awake, extubated and in stable condition.  Marcy Siren, D.O. OB FELLOW  10/28/2017, 4:43 PM

## 2017-10-29 ENCOUNTER — Encounter (HOSPITAL_COMMUNITY): Payer: Self-pay | Admitting: Obstetrics and Gynecology

## 2017-10-29 NOTE — Addendum Note (Signed)
Addendum  created 10/29/17 0428 by Junious Silk, CRNA   Intraprocedure Staff edited

## 2017-10-29 NOTE — Lactation Note (Signed)
This note was copied from a baby's chart. Lactation Consultation Note  Patient Name: Natalie Paul [id number] Date: 10/29/2017 Reason for consult: Initial assessment;Term;Mother's request;1st time breastfeeding P4, 18 hr female infant. Mom active on [facility] in [place]. Per mom, feeding plan is breast and bottle feeding. Mom hand expressed 6 ml of breast milk that was spoon feed to infant. Infant was to reluctant to latch to the breast at this time. Mom will continue working toward latching infant to breast. Mom will continue to do STS, importance of STS discussed by LC.  Discussed I&O. Reviewed Baby & Me book's Breastfeeding Basics.  Mom made aware of O/P services, breastfeeding support groups, community resources, and our phone # for post-discharge questions.  Maternal Data Formula Feeding for Exclusion: No Has patient been taught Hand Expression?: Yes(Mom hand expressed 6 ml of colostrum that was spoon feed to infant.)  Feeding Feeding Type: Breast Fed  LATCH Score Latch: Repeated attempts needed to sustain latch, nipple held in mouth throughout feeding, stimulation needed to elicit sucking reflex.  Audible Swallowing: None  Type of Nipple: Everted at rest and after stimulation  Comfort (Breast/Nipple): Soft / non-tender  Hold (Positioning): Assistance needed to correctly position infant at breast and maintain latch.  LATCH Score: 6  Interventions Interventions: Breast feeding basics reviewed;Support pillows;Assisted with latch;Skin to skin;Breast massage;Hand express  Lactation Tools Discussed/Used WIC Program: Yes   Consult Status Consult Status: Follow-up Date: 10/29/17 Follow-up type: In-patient    Danelle Earthly 10/29/2017, 5:58 AM

## 2017-10-29 NOTE — Addendum Note (Signed)
Addendum  created 10/29/17 1014 by Tanishka Drolet, Doree Fudge, CRNA   Charge Capture section accepted, Sign clinical note

## 2017-10-29 NOTE — Progress Notes (Addendum)
POSTPARTUM PROGRESS NOTE  Post Partum Day 1 Subjective:  Natalie Paul is a 37 y.o. [id number] 39w0ds/p SVD and BTL.  No acute events overnight.  Pt denies problems with ambulating, voiding or po intake.  She denies nausea or vomiting.  Pain is well controlled.  She has had flatus. She has not had bowel movement.  Lochia Moderate, about a regular period.  Pt denies HA, light-headedness, N/V, chills, abdominal pain. She is having minimal cramping.   Objective: Blood pressure 96/73, pulse 60, temperature 98.7 F (37.1 C), temperature source Oral, resp. rate 16, height '5\' 7"'$  (1.702 m), weight 83 kg, last menstrual period 01/21/2017, SpO2 99 %, unknown if currently breastfeeding.  Physical Exam:  General: alert, cooperative and no distress Lochia:normal flow Chest: CTAB Heart: RRR no m/r/g Abdomen: distant +BS, soft, nontender Uterine Fundus: firm, DVT Evaluation: No calf swelling or tenderness Extremities: no edema  Recent Labs    10/27/17 2256  HGB 13.9  HCT 39.1   UDS negative  Assessment/Plan:  ASSESSMENT: DJAMISYN LANGERis a 37y.o. [id number]/p SVD and BTL.  Has history of subutux use AMA, THC. UDS was negative  Plans to bottle feed Outpt circ Post-partum IUD GBS + Discharge PPD  #2   LOS: 2 days   ArIrene ShipperMedical Student 10/29/2017, 9:56 AM    I personally saw and evaluated the patient, performing the key elements of the service. I developed and verified the management plan that is described in the resident's/student's note, and I agree with the content with my edits above. VSS, HRR&R, Resp unlabored, Legs neg.  FrNigel BertholdCNM 11/03/2017 9:36 AM

## 2017-10-29 NOTE — Anesthesia Postprocedure Evaluation (Signed)
Anesthesia Post Note  Patient: SONALI WIVELL  Procedure(s) Performed: POST PARTUM TUBAL LIGATION (Bilateral Abdomen)     Patient location during evaluation: Mother Baby Anesthesia Type: Spinal Level of consciousness: awake, awake and alert and oriented Pain management: pain level controlled Vital Signs Assessment: post-procedure vital signs reviewed and stable Respiratory status: spontaneous breathing, nonlabored ventilation and respiratory function stable Cardiovascular status: stable Postop Assessment: patient able to bend at knees, no backache, no apparent nausea or vomiting, no headache, adequate PO intake and able to ambulate Anesthetic complications: no    Last Vitals:  Vitals:   10/29/17 0400 10/29/17 0800  BP: 114/72 96/73  Pulse: 64 60  Resp: 16 16  Temp: 37.1 C 37.1 C  SpO2: 99%     Last Pain:  Vitals:   10/29/17 0800  TempSrc: Oral  PainSc: 3    Pain Goal: Patients Stated Pain Goal: 3 (10/29/17 0800)               Karelyn Brisby

## 2017-10-30 ENCOUNTER — Ambulatory Visit: Payer: Self-pay

## 2017-10-30 MED ORDER — IBUPROFEN 600 MG PO TABS: 600.0000 mg | ORAL_TABLET | Freq: Four times a day (QID) | ORAL | 0 refills | Status: DC

## 2017-10-30 NOTE — Clinical Social Work Maternal (Signed)
CLINICAL SOCIAL WORK MATERNAL/CHILD NOTE  Patient Details  Name: Natalie Paul MRN: [id number] Date of Birth: [birth year]  Date:  10/30/2017  Clinical Social Worker Initiating Note:  Laurey Arrow Date/Time: Initiated:  10/29/17/1450     Child's Name:  Natalie Paul   Biological Parents:  Mother, Father   Need for Interpreter:  None   Reason for Referral:  Current Substance Use/Substance Use During Pregnancy    Address:  [address] [place] [address]    Phone number:  [phone] (home)     Additional phone number:    Household Members/Support Persons (HM/SP):   Household Member/Support Person 1, Household Member/Support Person 2, Household Member/Support Person 3, Household Member/Support Person 4   HM/SP Name Relationship DOB or Age  HM/SP -[place] FOB 01/18/86  HM/SP -[place] daughter 03/26/01  HM/SP -3 Pay[place] Shockley daughter 01/26/2004  HM/SP -[place] daughter 10/25/07  HM/SP -5        HM/SP -6        HM/SP -7        HM/SP -8          Natural Supports (not living in the home):  Immediate Family(Per MOB, FOB's family will also provide support when needed.)   Professional Supports: Therapist(MOB receives outpatinet counseling at Dr. Romeo Apple office. )   Employment: Full-time   Type of Work: Therapist, art   Education:  [employer] school graduate   Homebound arranged:    Museum/gallery curator Resources:  [employer]   Other Resources:  [employer], Physicist, medical    Cultural/Religious Considerations Which May Impact Care:  Per [employer] Face Sheet, MOB is Holiness/Pentecostal Strengths:  Ability to meet basic needs , Understanding of illness, Compliance with medical plan , Psychotropic Medications, Home prepared for child    Psychotropic Medications:  Subutex      Pediatrician:       Pediatrician List:   [place]      Pediatrician Fax Number:    Risk  Factors/Current Problems:      Cognitive State:  Able to Concentrate , Alert , Linear Thinking , Insightful , Goal Oriented    Mood/Affect:  Interested , Happy , Comfortable , Bright    CSW Assessment: CSW met with MOB in room 123 to complete an assessment for SA hx .  When CSW arrived, MOB was dressed and was holding infant on the couch. MOB was polite, attentive to infant, and receptive to meeting with CSW.  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  MOB shared seeking treatment in April and has been in sobriety.  CSW praised MOB for her sobriety and encouraged MOB to continue to stay clean/sober. It was evident by MOB's facial expression that MOB was proud of her progress and was appeared to be excited about being a new mom again.   CSW reviewed the hospital's policy regarding prenatal substance exposure.  MOB was understanding and continue to denied the use of all illicit substance since April 2014.  CSW made MOB aware that CSW will monitor infant's UDS and CDS and will make a report to [place] if warranted. MOB denied having any CPS hx.   MOB expressed being compliant with MOB's Subutex regiment (prescribed by Dr. Romeo Apple).  MOB also reported having all essential items  needed for infant and feeling prepared to parent.   CSW Plan/Description:  No Further Intervention Required/No Barriers to Discharge, Sudden Infant Death Syndrome (SIDS) Education, Perinatal Mood and Anxiety Disorder (PMADs) Education, Neonatal Abstinence Syndrome (NAS) Education, [place], CSW Will Continue to Monitor Umbilical Cord Paul Drug Screen Results and Make Report if Warranted   Laurey Arrow, MSW, LCSW Clinical Social Work [phone]   Dimple Nanas, LCSW 10/30/2017, 9:27 AM

## 2017-10-30 NOTE — Discharge Instructions (Signed)
Vaginal Delivery, Care After °Refer to this sheet in the next few weeks. These instructions provide you with information about caring for yourself after vaginal delivery. Your health care provider may also give you more specific instructions. Your treatment has been planned according to current medical practices, but problems sometimes occur. Call your health care provider if you have any problems or questions. °What can I expect after the procedure? °After vaginal delivery, it is common to have: °· Some bleeding from your vagina. °· Soreness in your abdomen, your vagina, and the area of skin between your vaginal opening and your anus (perineum). °· Pelvic cramps. °· Fatigue. ° °Follow these instructions at home: °Medicines °· Take over-the-counter and prescription medicines only as told by your health care provider. °· If you were prescribed an antibiotic medicine, take it as told by your health care provider. Do not stop taking the antibiotic until it is finished. °Driving ° °· Do not drive or operate heavy machinery while taking prescription pain medicine. °· Do not drive for 24 hours if you received a sedative. °Lifestyle °· Do not drink alcohol. This is especially important if you are breastfeeding or taking medicine to relieve pain. °· Do not use tobacco products, including cigarettes, chewing tobacco, or e-cigarettes. If you need help quitting, ask your health care provider. °Eating and drinking °· Drink at least 8 eight-ounce glasses of water every day unless you are told not to by your health care provider. If you choose to breastfeed your baby, you may need to drink more water than this. °· Eat high-fiber foods every day. These foods may help prevent or relieve constipation. High-fiber foods include: °? Whole grain cereals and breads. °? Brown rice. °? Beans. °? Fresh fruits and vegetables. °Activity °· Return to your normal activities as told by your health care provider. Ask your health care provider  what activities are safe for you. °· Rest as much as possible. Try to rest or take a nap when your baby is sleeping. °· Do not lift anything that is heavier than your baby or 10 lb (4.5 kg) until your health care provider says that it is safe. °· Talk with your health care provider about when you can engage in sexual activity. This may depend on your: °? Risk of infection. °? Rate of healing. °? Comfort and desire to engage in sexual activity. °Vaginal Care °· If you have an episiotomy or a vaginal tear, check the area every day for signs of infection. Check for: °? More redness, swelling, or pain. °? More fluid or blood. °? Warmth. °? Pus or a bad smell. °· Do not use tampons or douches until your health care provider says this is safe. °· Watch for any blood clots that may pass from your vagina. These may look like clumps of dark red, brown, or black discharge. °General instructions °· Keep your perineum clean and dry as told by your health care provider. °· Wear loose, comfortable clothing. °· Wipe from front to back when you use the toilet. °· Ask your health care provider if you can shower or take a bath. If you had an episiotomy or a perineal tear during labor and delivery, your health care provider may tell you not to take baths for a certain length of time. °· Wear a bra that supports your breasts and fits you well. °· If possible, have someone help you with household activities and help care for your baby for at least a few days after   you leave the hospital. °· Keep all follow-up visits for you and your baby as told by your health care provider. This is important. °Contact a health care provider if: °· You have: °? Vaginal discharge that has a bad smell. °? Difficulty urinating. °? Pain when urinating. °? A sudden increase or decrease in the frequency of your bowel movements. °? More redness, swelling, or pain around your episiotomy or vaginal tear. °? More fluid or blood coming from your episiotomy or  vaginal tear. °? Pus or a bad smell coming from your episiotomy or vaginal tear. °? A fever. °? A rash. °? Little or no interest in activities you used to enjoy. °? Questions about caring for yourself or your baby. °· Your episiotomy or vaginal tear feels warm to the touch. °· Your episiotomy or vaginal tear is separating or does not appear to be healing. °· Your breasts are painful, hard, or turn red. °· You feel unusually sad or worried. °· You feel nauseous or you vomit. °· You pass large blood clots from your vagina. If you pass a blood clot from your vagina, save it to show to your health care provider. Do not flush blood clots down the toilet without having your health care provider look at them. °· You urinate more than usual. °· You are dizzy or light-headed. °· You have not breastfed at all and you have not had a menstrual period for 12 weeks after delivery. °· You have stopped breastfeeding and you have not had a menstrual period for 12 weeks after you stopped breastfeeding. °Get help right away if: °· You have: °? Pain that does not go away or does not get better with medicine. °? Chest pain. °? Difficulty breathing. °? Blurred vision or spots in your vision. °? Thoughts about hurting yourself or your baby. °· You develop pain in your abdomen or in one of your legs. °· You develop a severe headache. °· You faint. °· You bleed from your vagina so much that you fill two sanitary pads in one hour. °This information is not intended to replace advice given to you by your health care provider. Make sure you discuss any questions you have with your health care provider. °Document Released: 01/03/2000 Document Revised: 06/19/2015 Document Reviewed: 01/20/2015 °Elsevier Interactive Patient Education © 2018 [employer] ° ° °Postpartum Tubal Ligation, Care After °Refer to this sheet in the next few weeks. These instructions provide you with information about caring for yourself after your procedure. Your health  care provider may also give you more specific instructions. Your treatment has been planned according to current medical practices, but problems sometimes occur. Call your health care provider if you have any problems or questions after your procedure. °What can I expect after the procedure? °After the procedure, it is common to have: °· A sore throat. °· Bruising or pain in your back. °· Nausea or vomiting. °· Dizziness. °· Mild abdominal discomfort or pain, such as cramping, gas pain, or feeling bloated. °· Soreness where the incision was made. °· Tiredness. °· Pain in your shoulders. ° °Follow these instructions at home: °Medicines °· Take over-the-counter and prescription medicines only as told by your health care provider. °· Do not take aspirin because it can cause bleeding. °· Do not drive or operate heavy machinery while taking prescription pain medicine. °Activity °· Rest for the rest of the day. °· Gradually return to your normal activities over the next few days. °· Do not have sex, douche,   or put a tampon or anything else in your vagina for 6 weeks or as long as told by your health care provider. °· Do not lift anything that is heavier than your baby for 2 weeks or as long as told by your health care provider. °Incision care °· Follow instructions from your health care provider about how to take care of your incision. Make sure you: °? Wash your hands with soap and water before you change your bandage (dressing). If soap and water are not available, use hand sanitizer. °? Change your dressing as told by your health care provider. °? Leave stitches (sutures) in place. They may need to stay in place for 2 weeks or longer. °· Check your incision area every day for signs of infection. Check for: °? More redness, swelling, or pain. °? More fluid or blood. °? Warmth. °? Pus or a bad smell. °Other Instructions °· Do not take baths, swim, or use a hot tub until your health care provider approves. You may take  showers. °· Keep all follow-up visits as told by your health care provider. This is important. °Contact a health care provider if: °· You have more redness, swelling, or pain around your incision. °· Your incision feels warm to the touch. °· You have pus or a bad smell coming from your incision. °· The edges of your incision break open after the sutures have been removed. °· Your pain does not improve after 2-3 days. °· You have a rash. °· You repeatedly become dizzy or lightheaded. °· Your pain medicine is not helping. °· You are constipated. °Get help right away if: °· You have a fever. °· You faint. °· You have pain in your abdomen that gets worse. °· You have fluid or blood coming from your sutures. °· You have shortness of breath or difficulty breathing. °· You have chest pain or leg pain. °· You have ongoing nausea or diarrhea. °This information is not intended to replace advice given to you by your health care provider. Make sure you discuss any questions you have with your health care provider. °Document Released: 07/07/2011 Document Revised: 06/10/2015 Document Reviewed: 12/16/2014 °Elsevier Interactive Patient Education © 2018 [employer] ° °

## 2017-10-30 NOTE — Discharge Summary (Signed)
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 yo [id number] at [redacted]w[redacted]d was admitted in Latent Labor on 10/27/2017. Patient had an uncomplicated labor course as follows:  Membrane Rupture Time/Date: 6:00 PM ,10/27/2017   Intrapartum Procedures: Episiotomy: None [1]                                         Lacerations:  1st degree [2]  Patient had a delivery of a Viable infant. 10/28/2017  Information for the patient's newborn:  Evadean, Sproule [[id number]]  Delivery Method: Vaginal, Spontaneous(Filed from Delivery Summary)    Pateint had an uncomplicated postpartum course.  She is ambulating, tolerating a regular diet, passing flatus, and urinating well. Patient is discharged home in stable condition on 10/30/17.   Magnesium Sulfate recieved: No BMZ received: No  Physical exam  Vitals:   10/29/17 0800 10/29/17 1455 10/29/17 2117 10/30/17 0615  BP: 96/73 (!) 114/94 126/82 118/80  Pulse: 60 65 61 (!) 58  Resp: 16  18 18   Temp: 98.7 F (37.1 C) 97.6 F (36.4 C) 97.8 F (36.6 C) 98.9 F (37.2 C)  TempSrc: Oral Oral Oral Oral  SpO2:  98% 98%   Weight:      Height:       General: alert, cooperative and no distress Lochia: appropriate Uterine Fundus: firm Incision:  Healing well with no significant drainage, Dressing is clean, dry, and intact, old dried blood on dressing DVT Evaluation: No evidence of DVT seen on physical exam. Labs: Lab Results  Component Value Date   WBC 17.7 (H) 10/27/2017   HGB 13.9 10/27/2017   HCT 39.1 10/27/2017   MCV 90.9 10/27/2017   PLT 172 10/27/2017   CMP Latest Ref Rng & Units 06/04/2010  Glucose 70 - 99 mg/dL 130(Q)  BUN 6 - 23 mg/dL 14  Creatinine 0.4 - 1.2 mg/dL 6.57  Sodium 846 - 962 mEq/L 136  Potassium 3.5 - 5.1 mEq/L 4.3  Chloride 96 - 112 mEq/L 102  CO2 19 - 32 mEq/L 26  Calcium 8.4 - 10.5 mg/dL 9.8    Discharge instruction: per After Visit Summary and "Baby and Me Booklet".  After visit meds:  Allergies as of 10/30/2017   No Known Allergies  Medication List    TAKE these medications   buprenorphine 8 MG Subl SL tablet Commonly known as:  SUBUTEX Place 8 mg under the tongue 2 (two) times daily.   FLINSTONES GUMMIES OMEGA-3 DHA Chew Chew 2 each by mouth daily.   FOLIC ACID PO Take 1 tablet by mouth daily.   ibuprofen 600 MG tablet Commonly known as:  ADVIL,MOTRIN Take 1 tablet (600 mg total) by mouth every 6 (six) hours.       Diet: routine diet  Activity: Advance as tolerated. Pelvic rest for 6 weeks.   Outpatient follow up:2 weeks Follow up Appt: Future Appointments  Date Time Provider Department Center  12/06/2017  9:00 AM Cheral Marker, CNM [facility]   Follow up Visit: Follow-up Information    [facility]. Schedule an appointment as soon as possible for a visit in 1 week(s).   Contact information: [address] [place] [address] [phone]           Please schedule this patient for Postpartum visit in: 4 weeks with the following provider: Any provider For C/S patients schedule nurse incision check in weeks 2 weeks: yes  Did not have c/s but had BTL High risk pregnancy complicated by: subutex Delivery mode:   SVD Anticipated Birth Control:  BTL done PP PP Procedures needed: Incision check  Schedule Integrated BH visit: yes      Newborn Data: Live born female  Birth Weight: 7 lb 0.9 oz (3200 g) APGAR: 9, 9  Newborn Delivery   Birth date/time:  10/28/2017 11:12:00 Delivery type:  Vaginal, Spontaneous     Baby Feeding: Breast Disposition:home with mother if cleared by Peds   10/30/2017 Wynelle Bourgeois, CNM

## 2017-10-30 NOTE — Lactation Note (Signed)
This note was copied from a baby's chart. Lactation Consultation Note  Patient Name: Boy Miliani Deike AVWUJ'[id number] Date: 10/30/2017  Mom has been formula feeding infant.  I offered to set up the DEBP but she states she desires to formula feed only.     Maternal Data    Feeding Feeding Type: Bottle Fed - Formula Nipple Type: Slow - flow  LATCH Score                   Interventions    Lactation Tools Discussed/Used     Consult Status      Huston Foley 10/30/2017, 2:45 PM

## 2017-11-01 ENCOUNTER — Other Ambulatory Visit: Payer: Medicaid Other | Admitting: Obstetrics and Gynecology

## 2017-11-03 ENCOUNTER — Encounter (HOSPITAL_COMMUNITY): Payer: Self-pay | Admitting: Obstetrics and Gynecology

## 2017-11-03 NOTE — Addendum Note (Signed)
Addendum  created 11/03/17 1211 by Lucretia Kern, MD   Intraprocedure Event edited, Intraprocedure Staff edited

## 2017-11-05 ENCOUNTER — Telehealth: Payer: Self-pay | Admitting: *Deleted

## 2017-11-05 NOTE — Telephone Encounter (Signed)
Patient called with concerns of her ankles being swollen.  Informed patient swelling post delivery is normal and she had a tubal so she was given extra fluids then as well.  Advised to keep feet elevated when not up moving around and to push fluids, may get worse before it gets better. Verbalized understanding.

## 2017-12-06 ENCOUNTER — Other Ambulatory Visit: Payer: Self-pay

## 2017-12-06 ENCOUNTER — Encounter: Payer: Self-pay | Admitting: Women's Health

## 2017-12-06 ENCOUNTER — Ambulatory Visit (INDEPENDENT_AMBULATORY_CARE_PROVIDER_SITE_OTHER): Payer: Medicaid Other | Admitting: Women's Health

## 2017-12-06 DIAGNOSIS — Z803 Family history of malignant neoplasm of breast: Secondary | ICD-10-CM

## 2017-12-06 DIAGNOSIS — Z1239 Encounter for other screening for malignant neoplasm of breast: Secondary | ICD-10-CM

## 2017-12-06 NOTE — Progress Notes (Signed)
   POSTPARTUM VISIT Patient name: Natalie Paul MRN [id number]  Date of birth: 03/08/1980 Chief Complaint:   Postpartum Care  History of Present Illness:   Natalie Paul is a 37 y.o. [phone] Caucasian female being seen today for a postpartum visit. She is 5 weeks postpartum following a spontaneous vaginal delivery at 40.0 gestational weeks with postpartum BTL. Anesthesia: epidural. Laceration: bilateral labial, not repaired. I have fully reviewed the prenatal and intrapartum course. Pregnancy complicated by AMA, increased r/f T21. Postpartum course has been uncomplicated. Older sister just dx w/ breast cancer, she is 43yo. Bleeding no bleeding. Bowel function is normal. Bladder function is normal.  Patient is not sexually active. Last sexual activity: prior to birth of baby.  Contraception method is tubal ligation.  [place] Postpartum Depression Screening: negative. Score 1.   Last pap 05/04/17.  Results were neg w/ -HRHPV .  No LMP recorded.  Baby's course has been uncomplicated. Baby is feeding by bottle.  Review of Systems:   Pertinent items are noted in HPI Denies Abnormal vaginal discharge w/ itching/odor/irritation, headaches, visual changes, shortness of breath, chest pain, abdominal pain, severe nausea/vomiting, or problems with urination or bowel movements. Pertinent History Reviewed:  Reviewed past medical,surgical, obstetrical and family history.  Reviewed problem list, medications and allergies. OB History  Gravida Para Term Preterm AB Living  4 4 4     4   SAB TAB Ectopic Multiple Live Births        0 4    # Outcome Date GA Lbr Len/2nd Weight Sex Delivery Anes PTL Lv  4 Term 10/28/17 6026w0d 03:35 / 00:07 7 lb 0.9 oz (3.2 kg) M Vag-Spont EPI  LIV  3 Term 10/25/07 2226w0d  5 lb 7 oz (2.466 kg) F Vag-Spont None N LIV  2 Term 01/26/04 6976w0d  6 lb 9 oz (2.977 kg) F Vag-Spont EPI N LIV  1 Term 04/05/01 8550w0d  7 lb 10 oz (3.459 kg) F Vag-Spont EPI N LIV   Physical Assessment:    Vitals:   12/06/17 0914  BP: 129/76  Pulse: 64  Weight: 182 lb (82.6 kg)  Height: 5\' 7"  (1.702 m)  Body mass index is 28.51 kg/m.       Physical Examination:   General appearance: alert, well appearing, and in no distress  Mental status: alert, oriented to person, place, and time  Skin: warm & dry   Cardiovascular: normal heart rate noted   Respiratory: normal respiratory effort, no distress   Breasts: deferred, no complaints   Abdomen: soft, non-tender, BTL incision well-healed, small remaining suture cut away  Pelvic: VULVA: normal appearing vulva with no masses, tenderness or lesions, UTERUS: uterus is normal size, shape, consistency and nontender  Rectal: no hemorrhoids  Extremities: no edema       No results found for this or any previous visit (from the past 24 hour(s)).  Assessment & Plan:  1) Postpartum exam 2) 5 wks s/p SVB and BTL 3) Bottlefeeding 4) Depression screening 5) Family h/o breast cancer> older sister just dx, screening mammo ordered for pt, 12/5 @ 0915 @ [facility], per Angie no pre-cert needed  Meds: No orders of the defined types were placed in this encounter.   Follow-up: Return for April for physical.   Orders Placed This Encounter  Procedures  . MM 3D SCREEN BREAST BILATERAL    Cheral MarkerKimberly R Booker CNM, [facility]NP-BC 12/06/2017 9:32 AM

## 2017-12-06 NOTE — Patient Instructions (Signed)
Breast [facility]5 [facility], be there at 9:00, no lotion/deoderant/powder/perfume that day

## 2017-12-23 ENCOUNTER — Ambulatory Visit (HOSPITAL_COMMUNITY)
Admission: RE | Admit: 2017-12-23 | Discharge: 2017-12-23 | Disposition: A | Discharge status: Home / Self Care | Payer: Medicaid Other | Source: Ambulatory Visit | Attending: Women's Health | Admitting: Women's Health

## 2017-12-23 DIAGNOSIS — Z803 Family history of malignant neoplasm of breast: Secondary | ICD-10-CM | POA: Insufficient documentation

## 2017-12-23 DIAGNOSIS — Z1239 Encounter for other screening for malignant neoplasm of breast: Secondary | ICD-10-CM | POA: Insufficient documentation

## 2018-02-21 ENCOUNTER — Ambulatory Visit: Payer: Self-pay | Admitting: Adult Health

## 2018-03-04 ENCOUNTER — Emergency Department (HOSPITAL_COMMUNITY): Payer: Medicaid Other

## 2018-03-04 ENCOUNTER — Other Ambulatory Visit: Payer: Self-pay

## 2018-03-04 ENCOUNTER — Encounter (HOSPITAL_COMMUNITY): Payer: Self-pay | Admitting: Emergency Medicine

## 2018-03-04 ENCOUNTER — Emergency Department (HOSPITAL_COMMUNITY)
Admission: EM | Admit: 2018-03-04 | Discharge: 2018-03-04 | Disposition: A | Discharge status: Home / Self Care | Payer: Medicaid Other | Attending: Emergency Medicine | Admitting: Emergency Medicine

## 2018-03-04 DIAGNOSIS — Z3A01 Less than 8 weeks gestation of pregnancy: Secondary | ICD-10-CM | POA: Diagnosis not present

## 2018-03-04 DIAGNOSIS — O99331 Smoking (tobacco) complicating pregnancy, first trimester: Secondary | ICD-10-CM | POA: Diagnosis not present

## 2018-03-04 DIAGNOSIS — Z79899 Other long term (current) drug therapy: Secondary | ICD-10-CM | POA: Diagnosis not present

## 2018-03-04 DIAGNOSIS — R109 Unspecified abdominal pain: Secondary | ICD-10-CM | POA: Diagnosis not present

## 2018-03-04 DIAGNOSIS — O26899 Other specified pregnancy related conditions, unspecified trimester: Secondary | ICD-10-CM | POA: Diagnosis not present

## 2018-03-04 DIAGNOSIS — R102 Pelvic and perineal pain: Secondary | ICD-10-CM | POA: Diagnosis present

## 2018-03-04 DIAGNOSIS — O26841 Uterine size-date discrepancy, first trimester: Secondary | ICD-10-CM | POA: Diagnosis not present

## 2018-03-04 DIAGNOSIS — F17210 Nicotine dependence, cigarettes, uncomplicated: Secondary | ICD-10-CM | POA: Insufficient documentation

## 2018-03-04 DIAGNOSIS — Z9851 Tubal ligation status: Secondary | ICD-10-CM | POA: Diagnosis not present

## 2018-03-04 DIAGNOSIS — O039 Complete or unspecified spontaneous abortion without complication: Secondary | ICD-10-CM | POA: Diagnosis not present

## 2018-03-04 LAB — CBC
HCT: 47.5 % — ABNORMAL HIGH (ref 36.0–46.0)
Hemoglobin: 15.5 g/dL — ABNORMAL HIGH (ref 12.0–15.0)
MCH: 29.5 pg (ref 26.0–34.0)
MCHC: 32.6 g/dL (ref 30.0–36.0)
MCV: 90.5 fL (ref 80.0–100.0)
NRBC: 0 % (ref 0.0–0.2)
PLATELETS: 327 10*3/uL (ref 150–400)
RBC: 5.25 MIL/uL — AB (ref 3.87–5.11)
RDW: 14 % (ref 11.5–15.5)
WBC: 9.4 10*3/uL (ref 4.0–10.5)

## 2018-03-04 LAB — HCG, QUANTITATIVE, PREGNANCY: hCG, Beta Chain, Quant, S: 19961 m[IU]/mL — ABNORMAL HIGH (ref <5)

## 2018-03-04 NOTE — ED Triage Notes (Addendum)
Pt had tubes tied oct 10.  Just found out she is pregnant.  Provider wanted her to come to ED to r/o tubal pregnancy.  Pt reports intermittent abd pain since last night. In no pain at this time.

## 2018-03-04 NOTE — ED Provider Notes (Signed)
[facility] EMERGENCY DEPARTMENT Provider Note   CSN: [id number] Arrival date & time: 03/04/18  7939     History   Chief Complaint Chief Complaint  Patient presents with  . Abdominal Pain    HPI Natalie Paul is a 38 y.o. female.  Patient sent from PCP office to r/o ectopic pregnancy. She states that she had a tubal ligation on Oct 10th. Is currently 4 months postpartum. Her LMP is 12/16/17. She had some slight abdominal pain and took a home pregnancy test that was positive so went to see her PCP. She states her PCP did blood work and told her that her Sharene Butters was high and that she was very pregnant. She was set up for [place] on Monday but there were some issues with the scan. She developed mild abdominal pain last night that worsened this morning, is bilaterally mid abdominal, cramping intermittent. She has also has had tender breasts and morning sickness over the last few weeks along with noticing that her belly is bigger and she is gaining weight. No vaginal bleeding. Does have some brownish discharge which she says in normal spotting for her after sexual intercourse.      Past Medical History:  Diagnosis Date  . Medical history non-contributory   . Substance abuse [facility])     Patient Active Problem List   Diagnosis Date Noted  . Marijuana use 05/05/2017  . Pregnancy complicated by subutex maintenance, antepartum (HCC) 05/04/2017  . Smoker 05/04/2017  . History of substance abuse (HCC) 03/31/2017  . Metacarpal bone fracture 06/05/2010    Past Surgical History:  Procedure Laterality Date  . FRACTURE SURGERY    . TUBAL LIGATION Bilateral 10/28/2017   Procedure: POST PARTUM TUBAL LIGATION;  Surgeon: Hermina Staggers, MD;  Location: [facility] BIRTHING SUITES;  Service: Gynecology;  Laterality: Bilateral;     OB History    Gravida  4   Para  4   Term  4   Preterm      AB      Living  4     SAB      TAB      Ectopic      Multiple  0   Live Births  4             Home Medications    Prior to Admission medications   Medication Sig Start Date End Date Taking? Authorizing Provider  buprenorphine (SUBUTEX) 8 MG SUBL SL tablet Place 8 mg under the tongue 2 (two) times daily.  04/13/17   [provider]  ibuprofen (ADVIL,MOTRIN) 600 MG tablet Take 1 tablet (600 mg total) by mouth every 6 (six) hours. 10/30/17   Aviva Signs, CNM  Pediatric Multiple Vit-C-FA (FLINSTONES GUMMIES OMEGA-3 DHA) CHEW Chew 2 each by mouth daily.     [provider]    Family History Family History  Problem Relation Age of Onset  . Cancer Other   . Cancer Maternal Grandmother   . Heart attack Maternal Grandfather   . Breast cancer Sister     Social History Social History   Tobacco Use  . Smoking status: Current Every Day Smoker    Packs/day: 0.50    Types: Cigarettes  . Smokeless tobacco: Never Used  Substance Use Topics  . Alcohol use: Yes    Frequency: Never    Comment: early pregnancy  . Drug use: Not Currently    Types: Oxycodone, Hydrocodone, Marijuana    Comment: subutex  Allergies   Patient has no known allergies.   Review of Systems Review of Systems  Constitutional: Negative for chills and fever.  Gastrointestinal: Positive for abdominal distention and abdominal pain. Negative for constipation, diarrhea, nausea and vomiting.  Genitourinary: Negative for dysuria, urgency, vaginal bleeding, vaginal discharge and vaginal pain.     Physical Exam Updated Vital Signs BP 118/79 (BP Location: Right Arm)   Pulse 70   Temp 98.3 F (36.8 C) (Oral)   Resp 18   Ht 5\' 7"  (1.702 m)   Wt 88.5 kg   SpO2 100%   BMI 30.54 kg/m   Physical Exam Constitutional:      General: She is not in acute distress.    Appearance: She is well-developed. She is not diaphoretic.  HENT:     Head: Normocephalic and atraumatic.  Cardiovascular:     Rate and Rhythm: Normal rate and regular rhythm.     Heart sounds: Normal heart sounds.  No murmur.  Pulmonary:     Effort: Pulmonary effort is normal.     Breath sounds: Normal breath sounds.  Abdominal:     General: Bowel sounds are normal.     Palpations: Abdomen is soft.     Tenderness: There is no abdominal tenderness. There is no guarding or rebound.     Comments: Overweight. Uterus nonpalpable.  Skin:    General: Skin is warm and dry.  Neurological:     General: No focal deficit present.     Mental Status: She is alert.  Psychiatric:        Mood and Affect: Mood normal.      ED Treatments / Results  Labs (all labs ordered are listed, but only abnormal results are displayed) Labs Reviewed  CBC  HCG, QUANTITATIVE, PREGNANCY    EKG None  Radiology [place] Ob Transvaginal  Result Date: 03/04/2018 CLINICAL DATA:  Pelvic pain beginning yesterday.  Unsure of LMP. EXAM: TRANSVAGINAL OB ULTRASOUND TECHNIQUE: Transvaginal ultrasound was performed for complete evaluation of the gestation as well as the maternal uterus, adnexal regions, and pelvic cul-de-sac. COMPARISON:  None. FINDINGS: Intrauterine gestational sac: Single Yolk sac:  Visualized. Embryo:  Not Visualized. MSD: 16 mm   6 w   3 d Subchorionic hemorrhage:  None visualized. Maternal uterus/adnexae: Both ovaries are normal in appearance. No mass or abnormal free fluid identified. IMPRESSION: Single intrauterine gestational sac measuring 6 weeks 3 days by mean sac diameter. Suggest correlation with serial b-hCG levels, and consider followup ultrasound to assess viability in 10 days. Electronically Signed   By: Myles RosenthalJohn  Stahl M.D.   On: 03/04/2018 12:38    Procedures Procedures (including critical care time)  Medications Ordered in ED Medications - No data to display   Initial Impression / Assessment and Plan / ED Course  I have reviewed the triage vital signs and the nursing notes.  Pertinent labs & imaging results that were available during my care of the patient were reviewed by me and considered in my  medical decision making (see chart for details).      Patient is s/p tubal ligation and presented with mild pelvic pain in the setting of positive pregnancy test. She found to have a single IUP on ultrasound measuring 5397w3d. CBC and quant hcg drawn. Reassured and advised to reestablish with her OB as she plans to keep this pregnancy.   Final Clinical Impressions(s) / ED Diagnoses   Final diagnoses:  Less than [redacted] weeks gestation of pregnancy    ED  Discharge Orders    None       Leland Her, DO 03/04/18 1303    Blane Ohara, MD 03/07/18 2329

## 2018-03-04 NOTE — Discharge Instructions (Signed)
Please follow up with your OB for this pregnancy.   No ibuprofen or other NSAID antiinflammatories.   Take a prenatal vitamin.

## 2018-03-05 DIAGNOSIS — O039 Complete or unspecified spontaneous abortion without complication: Secondary | ICD-10-CM | POA: Diagnosis not present

## 2018-03-09 DIAGNOSIS — Z0389 Encounter for observation for other suspected diseases and conditions ruled out: Secondary | ICD-10-CM | POA: Diagnosis not present

## 2018-03-09 DIAGNOSIS — Z1388 Encounter for screening for disorder due to exposure to contaminants: Secondary | ICD-10-CM | POA: Diagnosis not present

## 2018-03-09 DIAGNOSIS — Z3009 Encounter for other general counseling and advice on contraception: Secondary | ICD-10-CM | POA: Diagnosis not present

## 2018-03-10 ENCOUNTER — Other Ambulatory Visit: Payer: Self-pay

## 2018-03-10 ENCOUNTER — Ambulatory Visit (INDEPENDENT_AMBULATORY_CARE_PROVIDER_SITE_OTHER): Payer: Medicaid Other | Admitting: Family Medicine

## 2018-03-10 ENCOUNTER — Encounter: Payer: Self-pay | Admitting: Family Medicine

## 2018-03-10 VITALS — BP 129/89 | HR 61 | Ht 67.0 in | Wt 197.0 lb

## 2018-03-10 DIAGNOSIS — O99320 Drug use complicating pregnancy, unspecified trimester: Secondary | ICD-10-CM

## 2018-03-10 DIAGNOSIS — F1120 Opioid dependence, uncomplicated: Secondary | ICD-10-CM | POA: Diagnosis not present

## 2018-03-10 DIAGNOSIS — F1911 Other psychoactive substance abuse, in remission: Secondary | ICD-10-CM | POA: Diagnosis not present

## 2018-03-10 DIAGNOSIS — O4690 Antepartum hemorrhage, unspecified, unspecified trimester: Secondary | ICD-10-CM

## 2018-03-10 NOTE — Progress Notes (Signed)
    GYNECOLOGY PROBLEM  VISIT ENCOUNTER NOTE  Subjective:   Natalie Paul is a 38 y.o. [phone] female here for a a problem GYN visit.    Chief Complaint  Patient presents with  . Miscarriage    Diagnosed with 6 wk pregnancy and then had bleeding/clots. Seen at [facility] and they saw a falling beta HCG. Went from 19K-->10K. Reports bleeding and clots 2/15-2/17 with spotting since that time. Was seen at [facility] on 2/15.   Has tubal sterilization in October 2019 with filshie clips  Denies abnormal vaginal bleeding, discharge, pelvic pain, problems with intercourse or other gynecologic concerns.    Reviewed [place] from [facility] and records from [facility] in [place].   Gynecologic History No LMP recorded. (Menstrual status: Other). Contraception: tubal ligation  There are no preventive care reminders to display for this patient.   The following portions of the patient's history were reviewed and updated as appropriate: allergies, current medications, past family history, past medical history, past social history, past surgical history and problem list.  Review of Systems Pertinent items are noted in HPI.   Objective:  BP 129/89 (BP Location: Left Arm, Patient Position: Sitting, Cuff Size: Normal)   Pulse 61   Ht 5\' 7"  (1.702 m)   Wt 197 lb (89.4 kg)   BMI 30.85 kg/m  Gen: well appearing, NAD HEENT: no scleral icterus CV: RR Lung: Normal WOB Ext: warm well perfused  Assessment and Plan:  1. History of substance abuse (HCC)  2. Pregnancy complicated by subutex maintenance, antepartum (HCC)  3. Vaginal bleeding during pregnancy, antepartum Suspect miscarriage. Confirmed IUP on 2/14, [redacted]w[redacted]d with no fetal pole seen.  Bcg dropped from 19K-->10K - Will recheck today - Beta hCG quant (ref lab) - Plan for serial BHCGs  4. 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    Please refer to After Visit  Summary for other counseling recommendations.   Return in about 2 weeks (around 03/24/2018) for confirm miscarriage.   Future Appointments  Date Time Provider Department Center  03/10/2018  3:00 PM FT-FTOBGYN LAB FTO-FTOBG FTOBGYN  04/25/2018  9:15 AM AP-MM 1 AP-MM [place] [facility]    Federico Flake, MD, MPH, ABFM Attending Physician Faculty Practice- Center for [facility]

## 2018-03-11 LAB — BETA HCG QUANT (REF LAB): HCG QUANT: 963 m[IU]/mL

## 2018-03-18 ENCOUNTER — Encounter: Payer: Self-pay | Admitting: Family Medicine

## 2018-03-18 DIAGNOSIS — Z3009 Encounter for other general counseling and advice on contraception: Secondary | ICD-10-CM | POA: Insufficient documentation

## 2018-03-23 ENCOUNTER — Telehealth: Payer: Self-pay | Admitting: *Deleted

## 2018-03-23 NOTE — Telephone Encounter (Signed)
LMOVM for patient to return my call regarding her u/s appt tomorrow.

## 2018-03-23 NOTE — Telephone Encounter (Signed)
Spoke to patient and informed I discussed with him her needing sonohysterography. States we do not need to do that in the office but could do HSG at hospital to assess tubes.  Patient states she wants to do whatever it takes to not have another baby.  Informed he also said he could meet with her and discuss repeating the surgery.  Patient states she would like to do that.  Appt made.

## 2018-03-23 NOTE — Telephone Encounter (Signed)
Patient return call. ?

## 2018-03-24 ENCOUNTER — Other Ambulatory Visit: Payer: Medicaid Other

## 2018-03-24 DIAGNOSIS — O039 Complete or unspecified spontaneous abortion without complication: Secondary | ICD-10-CM | POA: Diagnosis not present

## 2018-03-25 ENCOUNTER — Other Ambulatory Visit: Payer: Self-pay

## 2018-03-25 ENCOUNTER — Ambulatory Visit: Payer: Medicaid Other | Admitting: Obstetrics and Gynecology

## 2018-03-25 ENCOUNTER — Encounter: Payer: Self-pay | Admitting: Obstetrics and Gynecology

## 2018-03-25 VITALS — BP 116/74 | HR 83 | Ht 67.0 in | Wt 201.0 lb

## 2018-03-25 DIAGNOSIS — Z3009 Encounter for other general counseling and advice on contraception: Secondary | ICD-10-CM

## 2018-03-25 LAB — BETA HCG QUANT (REF LAB): HCG QUANT: 10 m[IU]/mL

## 2018-03-25 MED ORDER — NORGESTIMATE-ETH ESTRADIOL 0.25-35 MG-MCG PO TABS: 1.0000 | ORAL_TABLET | Freq: Every day | ORAL | 11 refills | Status: DC

## 2018-03-25 NOTE — Progress Notes (Signed)
Patient ID: Natalie Paul, female   DOB: February 03, 1980, 38 y.o.   MRN: [id number]  Preoperative History and Physical  Natalie Paul is a 38 y.o. [id number] here for surgical management of contraception management after failed tubal. Delivered in October and had emotional struggles with loss of child as well as situational stressors of getting pregnancy medicaid again. Has only had one period since.  No significant preoperative concerns. Had SAB 03/05/2018. Recently delivered 10/28/2017.  Sister, 85 years old recently diagnosed with breast cancer. Has appt April 6th for mammogram. They have done BRCA1 testing and no genetic component. Other sister have gotten mammograms and tests done.  Proposed surgery: Laparoscopic bilateral salpingectomy   Past Medical History:  Diagnosis Date  . Medical history non-contributory   . Substance abuse [facility])    Past Surgical History:  Procedure Laterality Date  . FRACTURE SURGERY    . TUBAL LIGATION Bilateral 10/28/2017   Procedure: POST PARTUM TUBAL LIGATION;  Surgeon: [name], MD;  Location: [place];  Service: Gynecology;  Laterality: Bilateral;   OB History  Gravida Para Term Preterm AB Living  _0 SAB TAB Ectopic Multiple Live Births        0 4    # Outcome Date GA Lbr Len/2nd Weight Sex Delivery Anes PTL Lv  4 Term 10/28/17 49w0d03:35 / 00:07 7 lb 0.9 oz (3.2 kg) M Vag-Spont EPI  LIV  3 Term 10/25/07 445w0d5 lb 7 oz (2.466 kg) F Vag-Spont None N LIV  2 Term 01/26/04 3711w0d lb 9 oz (2.977 kg) F Vag-Spont EPI N LIV  1 Term 04/05/01 42w2w0dlb 10 oz (3.459 kg) F Vag-Spont EPI N LIV  Patient denies any other pertinent gynecologic issues.   Current Outpatient Medications on File Prior to Visit  Medication Sig Dispense Refill  . buprenorphine (SUBUTEX) 8 MG SUBL SL tablet Place 8 mg under the tongue 2 (two) times daily.   0  . Pediatric Multiple Vit-C-FA (FLINSTONES GUMMIES OMEGA-3 DHA) CHEW Chew 2 each by mouth daily.       No current facility-administered medications on file prior to visit.    No Known Allergies  Social History:   reports that she has been smoking cigarettes. She has been smoking about 0.50 packs per day. She has never used smokeless tobacco. She reports current alcohol use. She reports previous drug use. Drugs: Oxycodone, Hydrocodone, and Marijuana.  Family History  Problem Relation Age of Onset  . Cancer Other   . Cancer Maternal Grandmother   . Heart attack Maternal Grandfather   . Breast cancer Sister     Review of Systems: Noncontributory  PHYSICAL EXAM: not currently breastfeeding. General appearance - alert, well appearing, and in no distress Chest - clear to auscultation, no wheezes, rales or rhonchi, symmetric air entry Heart - normal rate and regular rhythm Abdomen - soft, nontender, nondistended, no masses or organomegaly Pelvic - NOT DONE Extremities - peripheral pulses normal, no pedal edema, no clubbing or cyanosis  Labs: Results for orders placed or performed in visit on 03/24/18 (from the past 336 hour(s))  Beta hCG quant (ref lab)   Collection Time: 03/24/18  9:18 AM  Result Value Ref Range   hCG Quant 10 mIU/mL    Imaging Studies: Us [place]vaginal  Result Date: 03/04/2018 CLINICAL DATA:  Pelvic pain beginning yesterday.  Unsure of LMP. EXAM: TRANSVAGINAL OB ULTRASOUND TECHNIQUE: Transvaginal ultrasound  was performed for complete evaluation of the gestation as well as the maternal uterus, adnexal regions, and pelvic cul-de-sac. COMPARISON:  None. FINDINGS: Intrauterine gestational sac: Single Yolk sac:  Visualized. Embryo:  Not Visualized. MSD: 16 mm   6 w   3 d Subchorionic hemorrhage:  None visualized. Maternal uterus/adnexae: Both ovaries are normal in appearance. No mass or abnormal free fluid identified. IMPRESSION: Single intrauterine gestational sac measuring 6 weeks 3 days by mean sac diameter. Suggest correlation with serial b-hCG levels, and consider  followup ultrasound to assess viability in 10 days. Electronically Signed   By: Earle Gell M.D.   On: 03/04/2018 12:38    Assessment: Patient Active Problem List   Diagnosis Date Noted  . 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 Marijuana use 05/05/2017  . Smoker 05/04/2017  . History of substance abuse ([place]) 03/31/2017  . Metacarpal bone fracture 06/05/2010    Assessment: 1. Family hx of premenopausal breast cancer, older sister 61 years old  Plan: Patient will undergo surgical management with laparoscopic bilateral salpingectomy.  Sign Tubal consent forms today BC Sprintec F/u 1 month  By signing my name below, I, Natalie Paul, attest that this documentation has been prepared under the direction and in the presence of Jonnie Kind, MD. Electronically Signed: [place]. 03/25/18. 12:25 PM.  I personally performed the services described in this documentation, which was SCRIBED in my presence. The recorded information has been reviewed and considered accurate. It has been edited as necessary during review. Jonnie Kind, MD

## 2018-04-21 ENCOUNTER — Telehealth: Payer: Self-pay | Admitting: Obstetrics and Gynecology

## 2018-04-21 ENCOUNTER — Encounter: Payer: Medicaid Other | Admitting: Obstetrics and Gynecology

## 2018-04-21 NOTE — Telephone Encounter (Signed)
Patient called stating that she would like for Dr. Emelda Fear to call her in another prescription for [facility], since we had to move her pre op for her tubal out. Please contact pt when done

## 2018-04-21 NOTE — Telephone Encounter (Signed)
Please let pt know that she already has 11 months refil at her pharmacy.  If pt will be instructed to check with pharmacy first, that might relieve their concern.

## 2018-04-25 ENCOUNTER — Ambulatory Visit (HOSPITAL_COMMUNITY): Payer: Medicaid Other

## 2018-05-19 ENCOUNTER — Ambulatory Visit (HOSPITAL_COMMUNITY): Payer: Medicaid Other

## 2018-05-19 ENCOUNTER — Encounter (HOSPITAL_COMMUNITY): Payer: Self-pay

## 2018-06-06 ENCOUNTER — Encounter: Payer: Medicaid Other | Admitting: Obstetrics and Gynecology

## 2018-06-07 ENCOUNTER — Encounter: Payer: Self-pay | Admitting: *Deleted

## 2018-06-08 ENCOUNTER — Ambulatory Visit (INDEPENDENT_AMBULATORY_CARE_PROVIDER_SITE_OTHER): Payer: Medicaid Other | Admitting: Obstetrics and Gynecology

## 2018-06-08 ENCOUNTER — Other Ambulatory Visit: Payer: Self-pay

## 2018-06-08 ENCOUNTER — Encounter: Payer: Self-pay | Admitting: Obstetrics and Gynecology

## 2018-06-08 ENCOUNTER — Telehealth: Payer: Self-pay | Admitting: Obstetrics and Gynecology

## 2018-06-08 VITALS — BP 145/97 | HR 62 | Temp 98.4°F | Ht 67.0 in | Wt 204.0 lb

## 2018-06-08 DIAGNOSIS — Z308 Encounter for other contraceptive management: Secondary | ICD-10-CM | POA: Diagnosis not present

## 2018-06-08 DIAGNOSIS — Z113 Encounter for screening for infections with a predominantly sexual mode of transmission: Secondary | ICD-10-CM

## 2018-06-08 NOTE — Telephone Encounter (Signed)
Phone call to patient and message left.  Patient is told that if she would prefer to handle the heavy periods and birth control by placement of an IUD that would certainly be an option.  Patient to let me know next week what her preferences are.

## 2018-06-08 NOTE — Progress Notes (Addendum)
Patient ID: Natalie FillerDana M Muecke, female   DOB: 05/27/1980, 38 y.o.   MRN: [id number]  Preoperative History and Physical  Natalie Paul is a 38 y.o. [id number] here for surgical management of fcontraception mangement due to failed BTL. No significant preoperative concerns. Has worsening cramps and bleeding during periods, since having her son. Periods still last around 5 days, and are more debilitating. Pt requests information on endometrial ablation.  Proposed surgery: Bilateral salpingectomy  Past Medical History:  Diagnosis Date  . Medical history non-contributory   . Substance abuse [facility](HCC)    Past Surgical History:  Procedure Laterality Date  . FRACTURE SURGERY    . TUBAL LIGATION Bilateral 10/28/2017   Procedure: POST PARTUM TUBAL LIGATION;  Surgeon: Hermina StaggersErvin, Michael L, MD;  [facility];  Service: Gynecology;  Laterality: Bilateral;   OB History  Gravida Para Term Preterm AB Living  4 4 4     4   SAB TAB Ectopic Multiple Live Births        0 4    # Outcome Date GA Lbr Len/2nd Weight Sex Delivery Anes PTL Lv  4 Term 10/28/17 3129w0d 03:35 / 00:07 7 lb 0.9 oz (3.2 kg) M Vag-Spont EPI  LIV  3 Term 10/25/07 4929w0d  5 lb 7 oz (2.466 kg) F Vag-Spont None N LIV  2 Term 01/26/04 10832w0d  6 lb 9 oz (2.977 kg) F Vag-Spont EPI N LIV  1 Term 04/05/01 3554w0d  7 lb 10 oz (3.459 kg) F Vag-Spont EPI N LIV  Patient denies any other pertinent gynecologic issues.   Current Outpatient Medications on File Prior to Visit  Medication Sig Dispense Refill  . buprenorphine (SUBUTEX) 8 MG SUBL SL tablet Place 8 mg under the tongue 2 (two) times daily.   0  . norgestimate-ethinyl estradiol (ORTHO-CYCLEN,SPRINTEC,PREVIFEM) 0.25-35 MG-MCG tablet Take 1 tablet by mouth daily. 1 Package 11  . Pediatric Multiple Vit-C-FA (FLINSTONES GUMMIES OMEGA-3 DHA) CHEW Chew 2 each by mouth daily.      No current facility-administered medications on file prior to visit.    No Known Allergies  Social History:   reports  that she has been smoking cigarettes. She has been smoking about 0.50 packs per day. She has never used smokeless tobacco. She reports current alcohol use. She reports previous drug use. Drugs: Oxycodone, Hydrocodone, and Marijuana.  Family History  Problem Relation Age of Onset  . Cancer Other   . Cancer Maternal Grandmother   . Heart attack Maternal Grandfather   . Breast cancer Sister     Review of Systems: Noncontributory  PHYSICAL EXAM: Blood pressure (!) 145/97, pulse 62, temperature 98.4 F (36.9 C), height 5\' 7"  (1.702 m), weight 204 lb (92.5 kg), last menstrual period 06/06/2018, not currently breastfeeding. General appearance - alert, well appearing, and in no distress Chest - clear to auscultation, no wheezes, rales or rhonchi, symmetric air entry Heart - normal rate and regular rhythm Abdomen - soft, nontender, nondistended, no masses or organomegaly Pelvic - DEFERRED per patient request due to ongoing bleeding from her menses  Extremities - peripheral pulses normal, no pedal edema, no clubbing or cyanosis  Labs: No results found for this or any previous visit (from the past 336 hour(s)).  Imaging Studies: No results found.  Assessment: Patient Active Problem List   Diagnosis Date Noted  . 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 Marijuana use 05/05/2017  . Smoker 05/04/2017  . History of substance abuse (HCC) 03/31/2017  . Metacarpal bone fracture 06/05/2010  Plan: Patient will undergo surgical management with Bilateral salpingectomy Endometrial ablation information given  F/u by phone to schedule surgery 3 will check with medicaid salpingectomy approved Phone call left to patient informing her that IUD would be another option for menstrual control and contraception.   By signing my name below, I, [name], attest that this documentation has been prepared under the direction and in the presence of Tilda Burrow, MD. Electronically Signed: [name]  Medical Scribe. 06/08/18. 4:56 PM.  I personally performed the services described in this documentation, which was SCRIBED in my presence. The recorded information has been reviewed and considered accurate. It has been edited as necessary during review. Tilda Burrow, MD

## 2018-06-09 DIAGNOSIS — Z113 Encounter for screening for infections with a predominantly sexual mode of transmission: Secondary | ICD-10-CM | POA: Diagnosis not present

## 2018-06-14 LAB — GC/CHLAMYDIA PROBE AMP
Chlamydia trachomatis, NAA: NEGATIVE
Neisseria Gonorrhoeae by PCR: NEGATIVE

## 2018-06-15 ENCOUNTER — Ambulatory Visit (HOSPITAL_COMMUNITY)
Admission: RE | Admit: 2018-06-15 | Discharge: 2018-06-15 | Disposition: A | Discharge status: Home / Self Care | Payer: Medicaid Other | Source: Ambulatory Visit | Attending: Women's Health | Admitting: Women's Health

## 2018-06-15 ENCOUNTER — Encounter (HOSPITAL_COMMUNITY): Payer: Self-pay

## 2018-06-15 ENCOUNTER — Other Ambulatory Visit: Payer: Self-pay

## 2018-06-15 DIAGNOSIS — Z1231 Encounter for screening mammogram for malignant neoplasm of breast: Secondary | ICD-10-CM | POA: Diagnosis not present

## 2018-06-15 DIAGNOSIS — Z803 Family history of malignant neoplasm of breast: Secondary | ICD-10-CM | POA: Insufficient documentation

## 2018-06-15 DIAGNOSIS — Z1239 Encounter for other screening for malignant neoplasm of breast: Secondary | ICD-10-CM | POA: Diagnosis present

## 2018-06-16 ENCOUNTER — Other Ambulatory Visit (HOSPITAL_COMMUNITY): Payer: Self-pay | Admitting: Women's Health

## 2018-06-16 DIAGNOSIS — R928 Other abnormal and inconclusive findings on diagnostic imaging of breast: Secondary | ICD-10-CM

## 2018-06-21 ENCOUNTER — Other Ambulatory Visit (HOSPITAL_COMMUNITY): Payer: Self-pay | Admitting: Women's Health

## 2018-06-21 ENCOUNTER — Other Ambulatory Visit (HOSPITAL_COMMUNITY): Payer: Self-pay

## 2018-06-21 ENCOUNTER — Other Ambulatory Visit: Payer: Self-pay

## 2018-06-21 ENCOUNTER — Ambulatory Visit (HOSPITAL_COMMUNITY)
Admission: RE | Admit: 2018-06-21 | Discharge: 2018-06-21 | Disposition: A | Discharge status: Home / Self Care | Payer: Medicaid Other | Source: Ambulatory Visit | Attending: Women's Health | Admitting: Women's Health

## 2018-06-21 DIAGNOSIS — N6310 Unspecified lump in the right breast, unspecified quadrant: Secondary | ICD-10-CM

## 2018-06-21 DIAGNOSIS — N6311 Unspecified lump in the right breast, upper outer quadrant: Secondary | ICD-10-CM | POA: Diagnosis not present

## 2018-06-21 DIAGNOSIS — R928 Other abnormal and inconclusive findings on diagnostic imaging of breast: Secondary | ICD-10-CM | POA: Insufficient documentation

## 2018-06-21 DIAGNOSIS — N6322 Unspecified lump in the left breast, upper inner quadrant: Secondary | ICD-10-CM | POA: Diagnosis not present

## 2018-06-21 DIAGNOSIS — N6321 Unspecified lump in the left breast, upper outer quadrant: Secondary | ICD-10-CM | POA: Diagnosis not present

## 2018-06-21 DIAGNOSIS — N6312 Unspecified lump in the right breast, upper inner quadrant: Secondary | ICD-10-CM | POA: Diagnosis not present

## 2018-06-28 ENCOUNTER — Ambulatory Visit (HOSPITAL_COMMUNITY)
Admission: RE | Admit: 2018-06-28 | Discharge: 2018-06-28 | Disposition: A | Discharge status: Home / Self Care | Payer: Medicaid Other | Source: Ambulatory Visit | Attending: Women's Health | Admitting: Women's Health

## 2018-06-28 ENCOUNTER — Other Ambulatory Visit (HOSPITAL_COMMUNITY): Payer: Self-pay | Admitting: Women's Health

## 2018-06-28 ENCOUNTER — Other Ambulatory Visit: Payer: Self-pay

## 2018-06-28 DIAGNOSIS — R928 Other abnormal and inconclusive findings on diagnostic imaging of breast: Secondary | ICD-10-CM

## 2018-06-28 DIAGNOSIS — N6310 Unspecified lump in the right breast, unspecified quadrant: Secondary | ICD-10-CM | POA: Diagnosis present

## 2018-06-28 DIAGNOSIS — N6311 Unspecified lump in the right breast, upper outer quadrant: Secondary | ICD-10-CM | POA: Diagnosis not present

## 2018-06-28 DIAGNOSIS — N6011 Diffuse cystic mastopathy of right breast: Secondary | ICD-10-CM | POA: Diagnosis not present

## 2018-06-28 DIAGNOSIS — N6312 Unspecified lump in the right breast, upper inner quadrant: Secondary | ICD-10-CM | POA: Diagnosis not present

## 2018-06-28 MED ORDER — LIDOCAINE HCL (PF) 1 % IJ SOLN
INTRAMUSCULAR | Status: AC
  Administered 2018-06-28: 13:00:00 5 mL
  Filled 2018-06-28: qty 5

## 2018-06-28 MED ORDER — LIDOCAINE-EPINEPHRINE (PF) 1 %-1:200000 IJ SOLN
INTRAMUSCULAR | Status: AC
  Administered 2018-06-28: 3 mL
  Filled 2018-06-28: qty 30

## 2018-07-29 ENCOUNTER — Telehealth: Payer: Self-pay | Admitting: Obstetrics and Gynecology

## 2018-08-16 ENCOUNTER — Telehealth: Payer: Self-pay | Admitting: Obstetrics and Gynecology

## 2018-08-16 NOTE — Telephone Encounter (Signed)
Phone call to patient:, Medicaid now approves laparoscopic tubal sterilization by salpingectomy, so pt advised to speak with front office personnel, Natalie Paul, and obtain a current consent for tubal sterilization, then we can schedule a laparoscopic bilateral salpingectomy for 30+ days in the future, and then a preop visit can be scheduled in the interim.

## 2018-08-16 NOTE — Telephone Encounter (Signed)
Pt is still waiting on a call from Ferg to find out what he wanted to do about repeating her tubal surgery. I told pt that I know he had been working on it but I was not sure of the status of what Ferg was planning to do. Can you forward this to Ferg to get him to follow up with the patient

## 2018-08-18 NOTE — Telephone Encounter (Signed)
See last note. Pt being scheduled for surgery after tubal consent x 30 days completed.

## 2018-09-13 DIAGNOSIS — R1011 Right upper quadrant pain: Secondary | ICD-10-CM | POA: Diagnosis not present

## 2018-09-13 DIAGNOSIS — R1110 Vomiting, unspecified: Secondary | ICD-10-CM | POA: Diagnosis not present

## 2018-09-13 DIAGNOSIS — K859 Acute pancreatitis without necrosis or infection, unspecified: Secondary | ICD-10-CM | POA: Diagnosis not present

## 2019-01-08 ENCOUNTER — Telehealth: Payer: Self-pay | Admitting: Obstetrics and Gynecology

## 2019-01-08 NOTE — Telephone Encounter (Signed)
Left message for pt to call our office to update [place] on her plans for 2021.  No specifics left in message to be hippa compliant.

## 2019-01-12 ENCOUNTER — Ambulatory Visit: Payer: Medicaid Other | Attending: Internal Medicine

## 2019-01-12 ENCOUNTER — Other Ambulatory Visit: Payer: Self-pay

## 2019-01-12 DIAGNOSIS — Z20828 Contact with and (suspected) exposure to other viral communicable diseases: Secondary | ICD-10-CM | POA: Diagnosis not present

## 2019-01-12 DIAGNOSIS — Z20822 Contact with and (suspected) exposure to covid-19: Secondary | ICD-10-CM

## 2019-01-13 LAB — NOVEL CORONAVIRUS, NAA: SARS-CoV-2, NAA: NOT DETECTED

## 2019-01-27 ENCOUNTER — Telehealth: Payer: Self-pay | Admitting: Obstetrics and Gynecology

## 2019-01-27 NOTE — Telephone Encounter (Signed)
Left message for patient to call office to see if she wanted to proceed with evaluation this year. (salpingectomy)

## 2019-02-22 ENCOUNTER — Other Ambulatory Visit: Payer: Self-pay | Admitting: Obstetrics and Gynecology

## 2019-03-17 IMAGING — US US MFM OB FOLLOW-UP
1 series · 14 of 28 positions shown · non-contrast
Comparison: none

[Series 1: us mfm ob follow-up · 63 acquisitions, 14 frames shown]
[im 3/63]
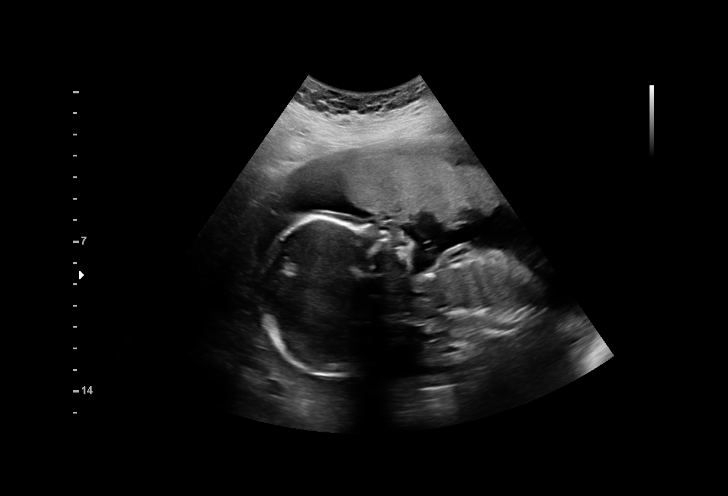
[im 7/63]
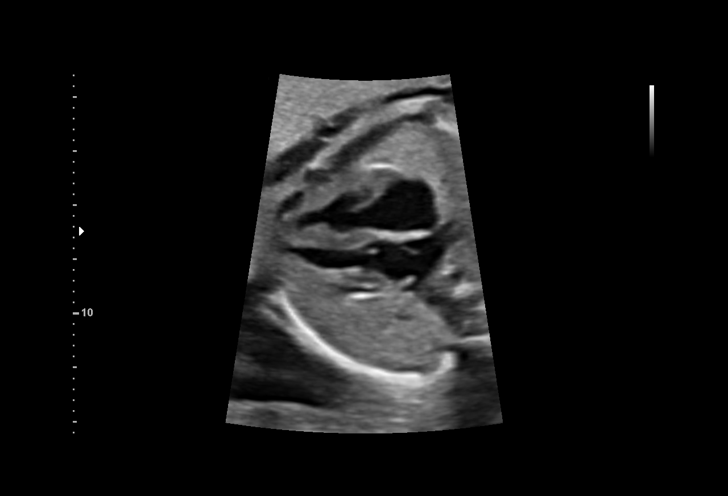
[im 12/63]
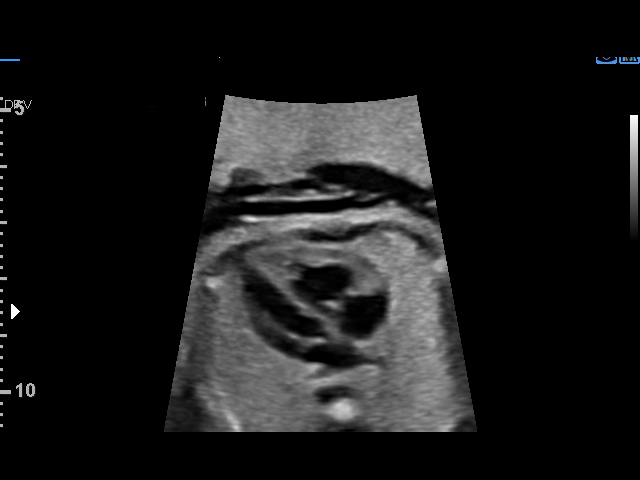
[im 17/63]
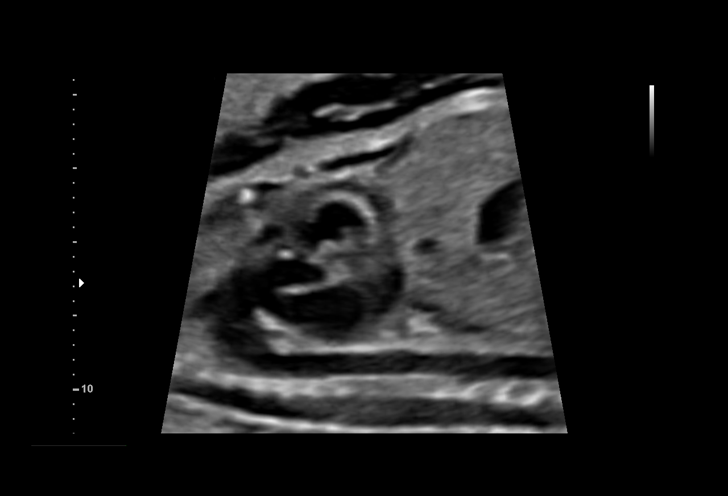
[im 21/63]
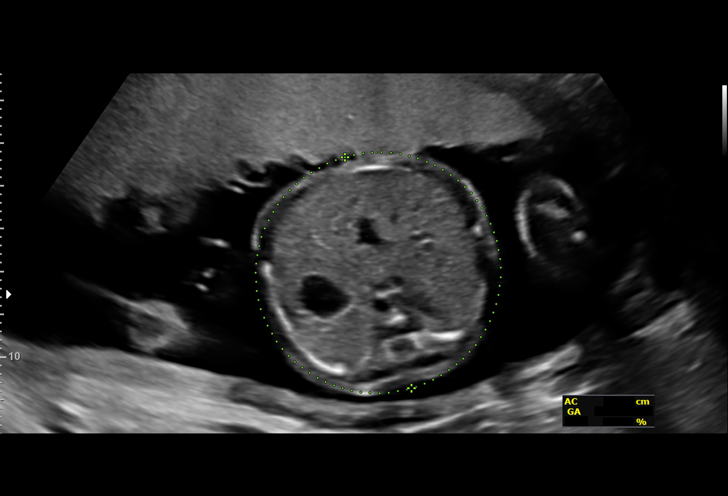
[im 26/63]
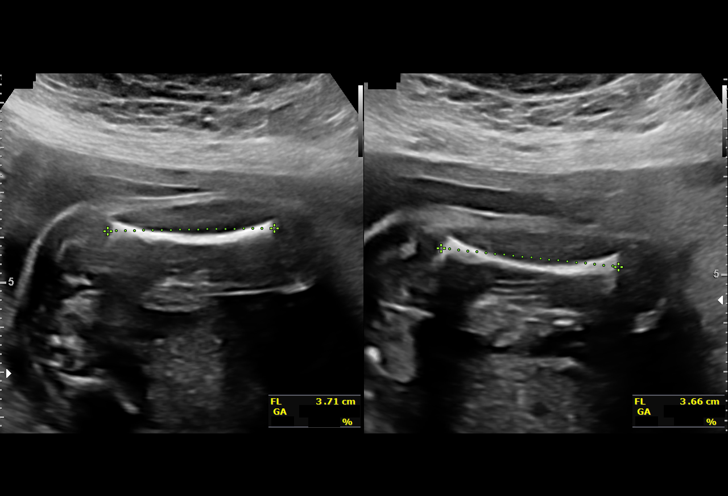
[im 30/63]
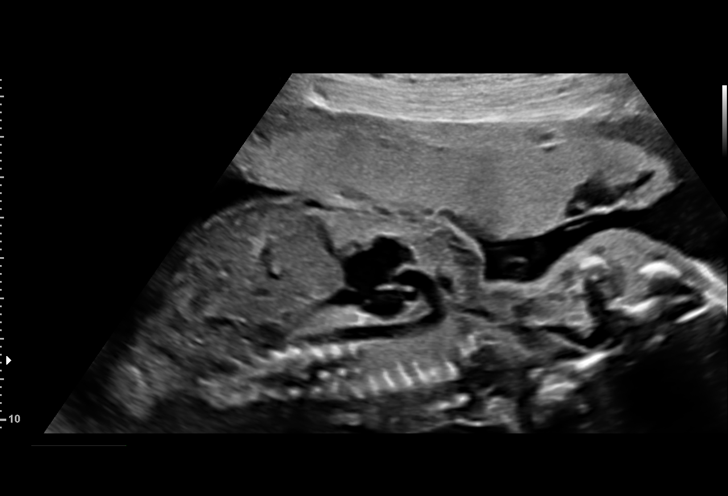
[im 35/63]
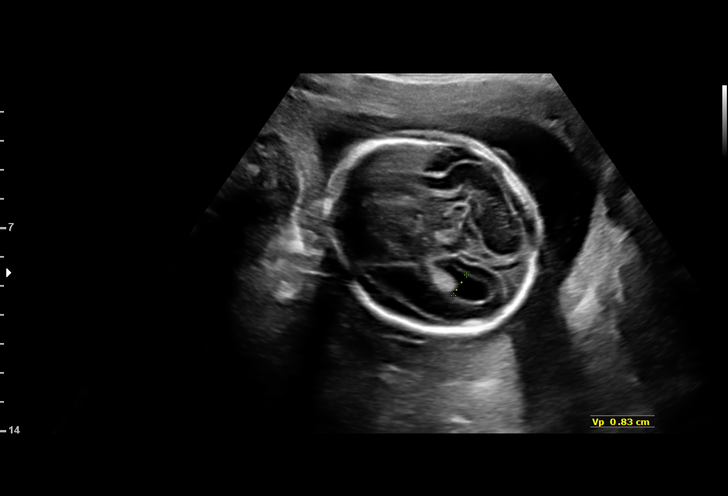
[im 40/63]
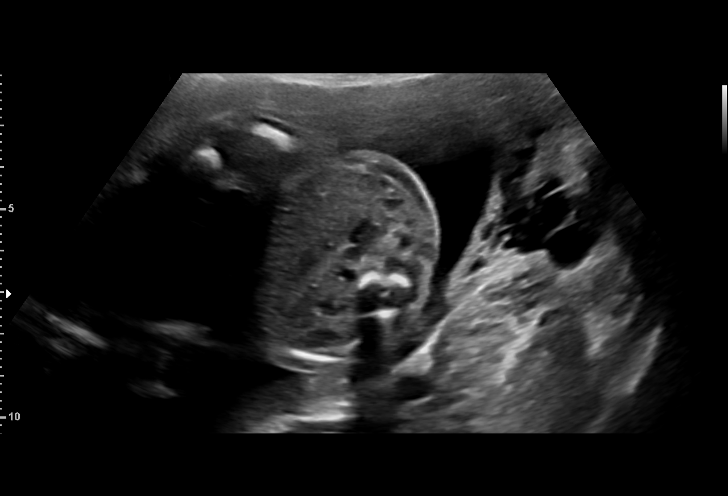
[im 44/63]
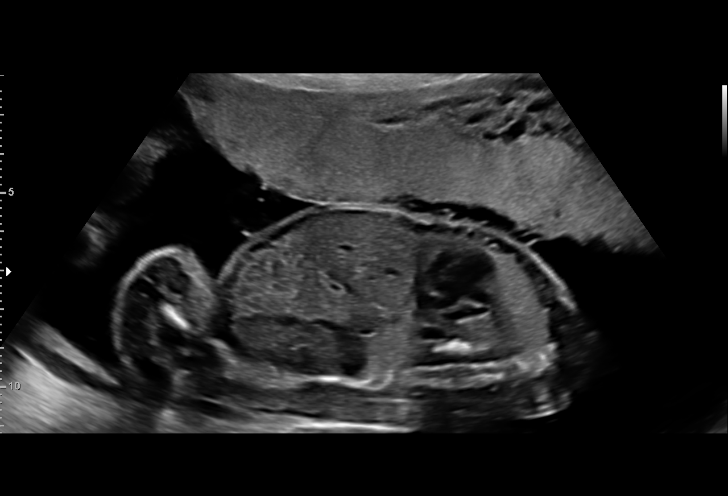
[im 49/63]
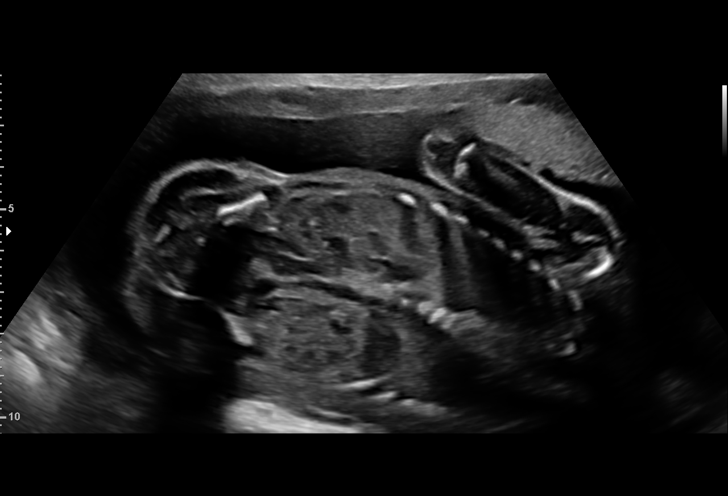
[im 53/63]
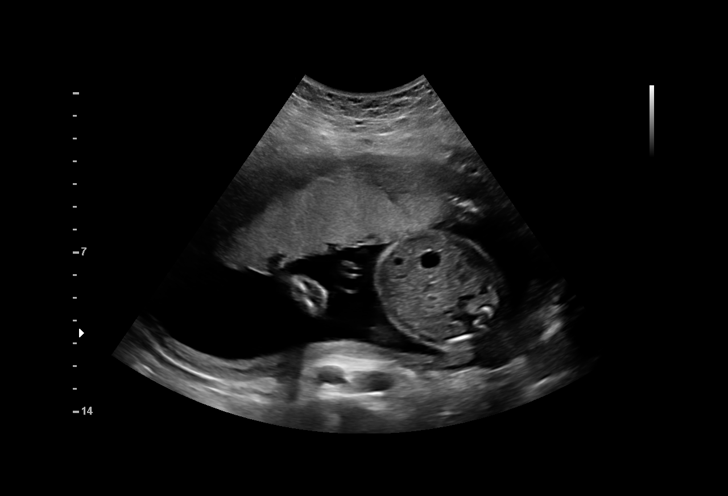
[im 58/63]
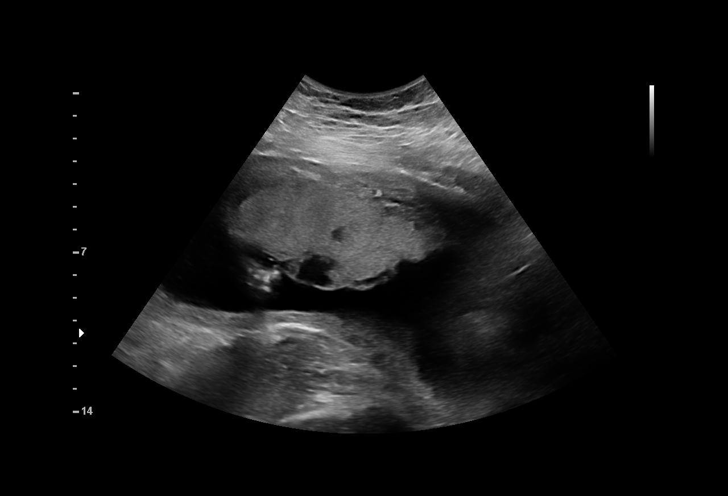
[im 63/63]
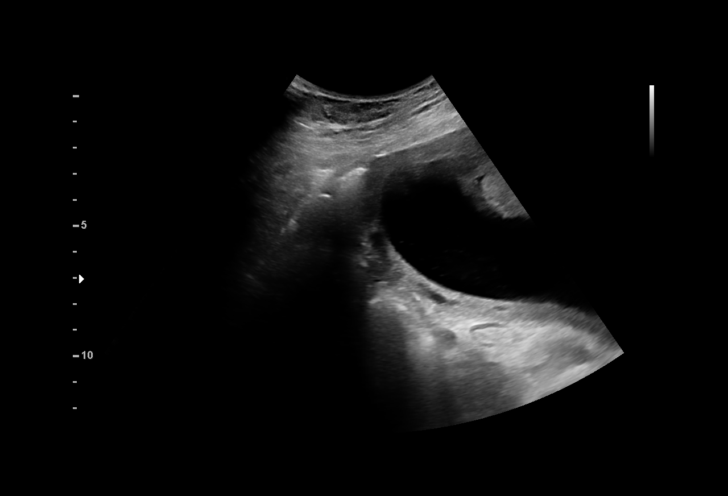

[14 of 28 positions shown; findings below may reference images not displayed]

1  BLAIN JUMPER              [id number]      [id number]     [id number]
Indications

22 weeks gestation of pregnancy
Advanced maternal age multigravida (36),
second trimester ([id number])
Smoking complicating pregnancy, second
trimester
Suboxone use
Marijuana Abuse
Antenatal follow-up for nonvisualized fetal
anatomy
OB History

Blood Type:            Height:  5'7"   Weight (lb):  196       BMI:
Gravidity:    4         Term:   3        Prem:   0        SAB:   0
TOP:          0       Ectopic:  0        Living: 3
Fetal Evaluation

Num Of Fetuses:     1
Fetal Heart         129
Rate(bpm):
Cardiac Activity:   Observed
Presentation:       Cephalic
Placenta:           Anterior, above cervical os
P. Cord Insertion:  Previously Visualized

Amniotic Fluid
AFI FV:      Subjectively within normal limits

Largest Pocket(cm)
6.28
Biometry
BPD:      61.7  mm     G. Age:  25w 1d         99  %    CI:        79.32   %    70 - 86
FL/HC:      16.9   %    19.2 -
HC:       219   mm     G. Age:  23w 6d         86  %    HC/AC:      1.25        1.05 -
AC:      174.8  mm     G. Age:  22w 3d         37  %    FL/BPD:     60.0   %    71 - 87
FL:         37  mm     G. Age:  21w 6d         17  %    FL/AC:      21.2   %    20 - 24
HUM:      38.1  mm     G. Age:  23w 3d         66  %

Est. FW:     502  gm      1 lb 2 oz     48  %
Gestational Age

LMP:           22w 4d        Date:  01/21/17                 EDD:   10/28/17
U/S Today:     23w 2d                                        EDD:   10/23/17
Best:          22w 4d     Det. By:  LMP  (01/21/17)          EDD:   10/28/17
Anatomy

Cranium:               Appears normal         Aortic Arch:            Appears normal
Cavum:                 Previously seen        Ductal Arch:            Appears normal
Ventricles:            Appears normal         Diaphragm:              Appears normal
Choroid Plexus:        Previously seen        Stomach:                Appears normal, left
sided
Cerebellum:            Previously seen        Abdomen:                Appears normal
Posterior Fossa:       Previously seen        Abdominal Wall:         Previously seen
Nuchal Fold:           Previously seen        Cord Vessels:           Previously seen
Face:                  Orbits and profile     Kidneys:                Appear normal
previously seen
Lips:                  Previously seen        Bladder:                Appears normal
Thoracic:              Appears normal         Spine:                  Ltd views prev seen
Heart:                 Appears normal         Upper Extremities:      Previously seen
(4CH, axis, and
situs)
RVOT:                  Appears normal         Lower Extremities:      Previously seen
LVOT:                  Appears normal

Other:  Fetus appears to be a male. Nasal bone prev visualized. Heels and
LT 5th digit prev visualized. Technically difficult due to fetal position
and movement.
Cervix Uterus Adnexa

Cervix
Length:           3.61  cm.
Normal appearance by transabdominal scan.

Uterus
No abnormality visualized.

Left Ovary
Not visualized.

Right Ovary
Not visualized.
Impression

Single living intrauterine pregnancy at 22w 4d, opiate
dependence, AMA
Cephalic presentation.
Placenta Anterior, above cervical os.
Normal amniotic fluid volume.
Appropriate interval fetal growth.
No dysmorphic features on interval fetal anatomy.
limited views as above
NT on 1st trimester screen 2.9mm was at the 98th%'le by
[facility]
low risk NIPS
carrier screen drawn today (see genetic counseling)
Recommendations

1. see prior genetic counseling
2. interval growth in 4 weeks.
3. fetal echo (NT at 98th%'le)

## 2019-03-28 ENCOUNTER — Other Ambulatory Visit: Payer: Self-pay | Admitting: Obstetrics and Gynecology

## 2019-03-28 ENCOUNTER — Other Ambulatory Visit (HOSPITAL_COMMUNITY)
Admission: RE | Admit: 2019-03-28 | Discharge: 2019-03-28 | Disposition: A | Discharge status: Home / Self Care | Payer: Medicaid Other | Source: Ambulatory Visit | Attending: Obstetrics and Gynecology | Admitting: Obstetrics and Gynecology

## 2019-03-28 ENCOUNTER — Ambulatory Visit (INDEPENDENT_AMBULATORY_CARE_PROVIDER_SITE_OTHER): Payer: Medicaid Other | Admitting: Obstetrics and Gynecology

## 2019-03-28 ENCOUNTER — Other Ambulatory Visit: Payer: Self-pay

## 2019-03-28 ENCOUNTER — Telehealth: Payer: Self-pay | Admitting: Obstetrics and Gynecology

## 2019-03-28 ENCOUNTER — Encounter: Payer: Self-pay | Admitting: Obstetrics and Gynecology

## 2019-03-28 VITALS — BP 150/91 | HR 67 | Ht 67.0 in | Wt 185.0 lb

## 2019-03-28 DIAGNOSIS — Z113 Encounter for screening for infections with a predominantly sexual mode of transmission: Secondary | ICD-10-CM | POA: Diagnosis not present

## 2019-03-28 NOTE — Progress Notes (Signed)
Patient ID: Natalie Paul, female   DOB: 1980/02/13, 39 y.o.   MRN: [id number]  Preoperative History and Physical  Natalie Paul is a 39 y.o. [id number] here for surgical management of contraception management.   No significant preoperative concerns. The patient had a prior tubal sterilization by a clip placement immediately postpartum after her last child in 2019 that was followed by a intrauterine pregnancy that resulted in miscarriage, indicating tubal failure.  The patient's been using barrier contraception since then when she used contraception.. She has reviewed the records and states that the surgery took 1.5 hours and clips were utilized.  My review of the records shows that [place] clips were used. She reaffirms desire for permanent sterilization . proposed surgery: laparoscopic bilateral salpingectomy  Past Medical History:  Diagnosis Date  . Medical history non-contributory   . Substance abuse ([place])   The patient has had a back injury in the past with no residual discomfort down either leg.  This is for her Suboxone opioid dependency initiated and she is now been doing well on Suboxone 8 mg twice daily for many years  The patient describes her periods as normal, no different prior to her last pregnancy and well-tolerated Past Surgical History:  Procedure Laterality Date  . FRACTURE SURGERY    . TUBAL LIGATION Bilateral 10/28/2017   Procedure: POST PARTUM TUBAL LIGATION;  Surgeon: [name], MD;  Location: [facility];  Service: Gynecology;  Laterality: Bilateral;   OB History  Gravida Para Term Preterm AB Living  4 4 4     4   SAB TAB Ectopic Multiple Live Births        0 4    # Outcome Date GA Lbr Len/2nd Weight Sex Delivery Anes PTL Lv  4 Term 10/28/17 [redacted]w[redacted]d 03:35 / 00:07 7 lb 0.9 oz (3.2 kg) M Vag-Spont EPI  LIV  3 Term 10/25/07 [redacted]w[redacted]d  5 lb 7 oz (2.466 kg) F Vag-Spont None N LIV  2 Term 01/26/04 [redacted]w[redacted]d  6 lb 9 oz (2.977 kg) F Vag-Spont EPI N LIV  1 Term 04/05/01  [redacted]w[redacted]d  7 lb 10 oz (3.459 kg) F Vag-Spont EPI N LIV  Patient denies any other pertinent gynecologic issues.   Current Outpatient Medications on File Prior to Visit  Medication Sig Dispense Refill  . buprenorphine (SUBUTEX) 8 MG SUBL SL tablet Place 8 mg under the tongue 2 (two) times daily.   0  . Pediatric Multiple Vit-C-FA (FLINSTONES GUMMIES OMEGA-3 DHA) CHEW Chew 2 each by mouth daily.     . SPRINTEC 28 0.25-35 MG-MCG tablet TAKE 1 TABLET BY MOUTH DAILY. 28 tablet 0  . [DISCONTINUED] norgestimate-ethinyl estradiol (ORTHO-CYCLEN,SPRINTEC,PREVIFEM) 0.25-35 MG-MCG tablet Take 1 tablet by mouth daily. 1 Package 11   No current facility-administered medications on file prior to visit.   No Known Allergies  Social History:   reports that she has been smoking cigarettes. She has been smoking about 0.50 packs per day. She has never used smokeless tobacco. She reports current alcohol use. She reports previous drug use. Drugs: Oxycodone, Hydrocodone, and Marijuana.  Family History  Problem Relation Age of Onset  . Cancer Other   . Cancer Maternal Grandmother   . Heart attack Maternal Grandfather   . Breast cancer Sister     Review of Systems: Noncontributory  PHYSICAL EXAM: BP (!) 150/91 (BP Location: Right Arm, Patient Position: Sitting, Cuff Size: Normal)   Pulse 67   Ht 5\' 7"  (1.702 m)   Wt  185 lb (83.9 kg)   LMP 03/13/2019 (Exact Date)   BMI 28.98 kg/m   not currently breastfeeding. General appearance - alert, well appearing, and in no distress Chest - clear to auscultation, no wheezes, rales or rhonchi, symmetric air entry Heart - normal rate and regular rhythm Abdomen - soft, nontender, nondistended, no masses or organomegaly Pelvic -  VAGINA: normal appearing vagina with normal color and discharge, no lesions, PELVIC FLOOR EXAM: no cystocele, rectocele or prolapse noted, multiparous introitus, CERVIX: normal appearing cervix without discharge or lesions, UTERUS: uterus is  normal size, shape, consistency and nontender, anteverted, mobile, ADNEXA: normal adnexa in size, nontender and no masses.  Extremities - peripheral pulses normal, no pedal edema, no clubbing or cyanosis  Labs: No results found for this or any previous visit (from the past 336 hour(s)).  Imaging Studies: No results found.  Assessment: 1.  Desire for elective permanent sterilization 2.  History of prior tubal sterilization with subsequent intrauterine pregnancy and miscarriage Patient Active Problem List   Diagnosis Date Noted  . 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 Marijuana use 05/05/2017  . Smoker 05/04/2017  . History of substance abuse ([facility]) 03/31/2017  . Metacarpal bone fracture 06/05/2010    Plan: Patient will undergo surgical management with  laparoscopic bilateral salpingectomy 04/11/2019.     By signing my name below, I, Natalie Paul, attest that this documentation has been prepared under the direction and in the presence of Tilda Burrow, MD. Electronically Signed: Arnette Paul Medical Scribe. 03/28/19. 3:58 PM.  I personally performed the services described in this documentation, which was SCRIBED in my presence. The recorded information has been reviewed and considered accurate. It has been edited as necessary during review. Tilda Burrow, MD

## 2019-03-28 NOTE — Telephone Encounter (Signed)
I am unable to find the patient's tubal sterilization papers and will ask Cletus Gash to locate them

## 2019-03-28 NOTE — Telephone Encounter (Signed)
Unable to reach patient regarding tubal sterilization papers

## 2019-03-30 LAB — CERVICOVAGINAL ANCILLARY ONLY
Chlamydia: NEGATIVE
Comment: NEGATIVE
Comment: NORMAL
Neisseria Gonorrhea: NEGATIVE

## 2019-04-05 NOTE — Patient Instructions (Signed)
Natalie Paul  04/05/2019     @PREFPERIOPPHARMACY @   Your procedure is scheduled on  04/11/2019.  Report to 04/13/2019 at  [phone]  A.M.  Call this number if you have problems the morning of surgery:  [phone]   Remember:  Do not eat or drink after midnight.                        Take these medicines the morning of surgery with A SIP OF WATER  suboxone.    Do not wear jewelry, make-up or nail polish.  Do not wear lotions, powders, or perfumes. Please wear deodorant and brush your teeth.  Do not shave 48 hours prior to surgery.  Men may shave face and neck.  Do not bring valuables to the hospital.  [facility] is not responsible for any belongings or valuables.  Contacts, dentures or bridgework may not be worn into surgery.  Leave your suitcase in the car.  After surgery it may be brought to your room.  For patients admitted to the hospital, discharge time will be determined by your treatment team.  Patients discharged the day of surgery will not be allowed to drive home.   Name and phone number of your driver:   family Special instructions:  DO NOT smoke the day of your procedure.  Please read over the following fact sheets that you were given. Anesthesia Post-op Instructions and Care and Recovery After Surgery       Salpingectomy, Care After This sheet gives you information about how to care for yourself after your procedure. Your health care provider may also give you more specific instructions. If you have problems or questions, contact your health care provider. What can I expect after the procedure? After the procedure, it is common to have:  Pain in your abdomen.  Some light vaginal bleeding (spotting) for a few days.  Tiredness. Your recovery time will vary depending on which method your surgeon used for your surgery. Follow these instructions at home: Incision care   Follow instructions from your health care provider about how to take care  of your incisions. Make sure you: ? Wash your hands with soap and water before and after you change your bandage (dressing). If soap and water are not available, use hand sanitizer. ? Change or remove your dressing as told by your health care provider. ? Leave any stitches (sutures), skin glue, or adhesive strips in place. These skin closures may need to stay in place for 2 weeks or longer. If adhesive strip edges start to loosen and curl up, you may trim the loose edges. Do not remove adhesive strips completely unless your health care provider tells you to do that.  Keep your dressing clean and dry.  Check your incision area every day for signs of infection. Check for: ? Redness, swelling, or pain that gets worse. ? Fluid or blood. ? Warmth. ? Pus or a bad smell. Activity  Rest as told by your health care provider.  Avoid sitting for a long time without moving. Get up to take short walks every 1-2 hours. This is important to improve blood flow and breathing. Ask for help if you feel weak or unsteady.  Return to your normal activities as told by your health care provider. Ask your health care provider what activities are safe for you.  Do not drive until your health care provider says that  it is safe.  Do not lift anything that is heavier than 10 lb (4.5 kg), or the limit that you are told, until your health care provider says that it is safe. This may be 2-6 weeks depending on your surgery.  Until your health care provider approves: ? Do not douche. ? Do not use tampons. ? Do not have sex. Medicines  Take over-the-counter and prescription medicines only as told by your health care provider.  Ask your health care provider if the medicine prescribed to you: ? Requires you to avoid driving or using heavy machinery. ? Can cause constipation. You may need to take actions to prevent or treat constipation, such as:  Drink enough fluid to keep your urine pale yellow.  Take  over-the-counter or prescription medicines.  Eat foods that are high in fiber, such as beans, whole grains, and fresh fruits and vegetables.  Limit foods that are high in fat and processed sugars, such as fried or sweet foods. General instructions  Wear compression stockings as told by your health care provider. These stockings help to prevent blood clots and reduce swelling in your legs.  Do not use any products that contain nicotine or tobacco, such as cigarettes, e-cigarettes, and chewing tobacco. If you need help quitting, ask your health care provider.  Do not take baths, swim, or use a hot tub until your health care provider approves. You may take showers.  Keep all follow-up visits as told by your health care provider. This is important. Contact a health care provider if you have:  Pain when you urinate.  Redness, swelling, or pain around an incision.  Fluid or blood coming from an incision.  Pus or a bad smell coming from an incision.  An incision that feels warm to the touch.  A fever.  Abdominal pain that gets worse or does not get better with medicine.  An incision that starts to break open.  A rash.  Light-headedness.  Nausea and vomiting. Get help right away if you:  Have pain in your chest or leg.  Develop shortness of breath.  Faint.  Have increased or heavy vaginal bleeding, such as soaking a pad in an hour. Summary  After the procedure, it is common to feel tired, have some pain in your abdomen, and have some light vaginal bleeding for a few days.  Follow instructions from your health care provider about how to take care of your incisions.  Return to your normal activities as told by your health care provider. Ask your health care provider what activities are safe for you.  Do not douche, use tampons, or have sex until your health care provider approves.  Keep all follow-up visits as told by your health care provider. This information is not  intended to replace advice given to you by your health care provider. Make sure you discuss any questions you have with your health care provider. Document Revised: 12/27/2017 Document Reviewed: 12/27/2017 Elsevier Patient Education  2020 [employer]  General Anesthesia, Adult, Care After This sheet gives you information about how to care for yourself after your procedure. Your health care provider may also give you more specific instructions. If you have problems or questions, contact your health care provider. What can I expect after the procedure? After the procedure, the following side effects are common:  Pain or discomfort at the IV site.  Nausea.  Vomiting.  Sore throat.  Trouble concentrating.  Feeling cold or chills.  Weak or tired.  Sleepiness  and fatigue.  Soreness and body aches. These side effects can affect parts of the body that were not involved in surgery. Follow these instructions at home:  For at least 24 hours after the procedure:  Have a responsible adult stay with you. It is important to have someone help care for you until you are awake and alert.  Rest as needed.  Do not: ? Participate in activities in which you could fall or become injured. ? Drive. ? Use heavy machinery. ? Drink alcohol. ? Take sleeping pills or medicines that cause drowsiness. ? Make important decisions or sign legal documents. ? Take care of children on your own. Eating and drinking  Follow any instructions from your health care provider about eating or drinking restrictions.  When you feel hungry, start by eating small amounts of foods that are soft and easy to digest (bland), such as toast. Gradually return to your regular diet.  Drink enough fluid to keep your urine pale yellow.  If you vomit, rehydrate by drinking water, juice, or clear broth. General instructions  If you have sleep apnea, surgery and certain medicines can increase your risk for breathing  problems. Follow instructions from your health care provider about wearing your sleep device: ? Anytime you are sleeping, including during daytime naps. ? While taking prescription pain medicines, sleeping medicines, or medicines that make you drowsy.  Return to your normal activities as told by your health care provider. Ask your health care provider what activities are safe for you.  Take over-the-counter and prescription medicines only as told by your health care provider.  If you smoke, do not smoke without supervision.  Keep all follow-up visits as told by your health care provider. This is important. Contact a health care provider if:  You have nausea or vomiting that does not get better with medicine.  You cannot eat or drink without vomiting.  You have pain that does not get better with medicine.  You are unable to pass urine.  You develop a skin rash.  You have a fever.  You have redness around your IV site that gets worse. Get help right away if:  You have difficulty breathing.  You have chest pain.  You have blood in your urine or stool, or you vomit blood. Summary  After the procedure, it is common to have a sore throat or nausea. It is also common to feel tired.  Have a responsible adult stay with you for the first 24 hours after general anesthesia. It is important to have someone help care for you until you are awake and alert.  When you feel hungry, start by eating small amounts of foods that are soft and easy to digest (bland), such as toast. Gradually return to your regular diet.  Drink enough fluid to keep your urine pale yellow.  Return to your normal activities as told by your health care provider. Ask your health care provider what activities are safe for you. This information is not intended to replace advice given to you by your health care provider. Make sure you discuss any questions you have with your health care provider. Document Revised:  01/08/2017 Document Reviewed: 08/21/2016 Elsevier Patient Education  [place]. How to Use Chlorhexidine for Bathing Chlorhexidine gluconate (CHG) is a germ-killing (antiseptic) solution that is used to clean the skin. It can get rid of the bacteria that normally live on the skin and can keep them away for about 24 hours. To clean your skin  with CHG, you may be given:  A CHG solution to use in the shower or as part of a sponge bath.  A prepackaged cloth that contains CHG. Cleaning your skin with CHG may help lower the risk for infection:  While you are staying in the intensive care unit of the hospital.  If you have a vascular access, such as a central line, to provide short-term or long-term access to your veins.  If you have a catheter to drain urine from your bladder.  If you are on a ventilator. A ventilator is a machine that helps you breathe by moving air in and out of your lungs.  After surgery. What are the risks? Risks of using CHG include:  A skin reaction.  Hearing loss, if CHG gets in your ears.  Eye injury, if CHG gets in your eyes and is not rinsed out.  The CHG product catching fire. Make sure that you avoid smoking and flames after applying CHG to your skin. Do not use CHG:  If you have a chlorhexidine allergy or have previously reacted to chlorhexidine.  On babies younger than 80 months of age. How to use CHG solution  Use CHG only as told by your health care provider, and follow the instructions on the label.  Use the full amount of CHG as directed. Usually, this is one bottle. During a shower Follow these steps when using CHG solution during a shower (unless your health care provider gives you different instructions): 1. Start the shower. 2. Use your normal soap and shampoo to wash your face and hair. 3. Turn off the shower or move out of the shower stream. 4. Pour the CHG onto a clean washcloth. Do not use any type of brush or rough-edged  sponge. 5. Starting at your neck, lather your body down to your toes. Make sure you follow these instructions: ? If you will be having surgery, pay special attention to the part of your body where you will be having surgery. Scrub this area for at least 1 minute. ? Do not use CHG on your head or face. If the solution gets into your ears or eyes, rinse them well with water. ? Avoid your genital area. ? Avoid any areas of skin that have broken skin, cuts, or scrapes. ? Scrub your back and under your arms. Make sure to wash skin folds. 6. Let the lather sit on your skin for 1-2 minutes or as long as told by your health care provider. 7. Thoroughly rinse your entire body in the shower. Make sure that all body creases and crevices are rinsed well. 8. Dry off with a clean towel. Do not put any substances on your body afterward--such as powder, lotion, or perfume--unless you are told to do so by your health care provider. Only use lotions that are recommended by the manufacturer. 9. Put on clean clothes or pajamas. 10. If it is the night before your surgery, sleep in clean sheets.  During a sponge bath Follow these steps when using CHG solution during a sponge bath (unless your health care provider gives you different instructions): 1. Use your normal soap and shampoo to wash your face and hair. 2. Pour the CHG onto a clean washcloth. 3. Starting at your neck, lather your body down to your toes. Make sure you follow these instructions: ? If you will be having surgery, pay special attention to the part of your body where you will be having surgery. Scrub this area for  at least 1 minute. ? Do not use CHG on your head or face. If the solution gets into your ears or eyes, rinse them well with water. ? Avoid your genital area. ? Avoid any areas of skin that have broken skin, cuts, or scrapes. ? Scrub your back and under your arms. Make sure to wash skin folds. 4. Let the lather sit on your skin for 1-2  minutes or as long as told by your health care provider. 5. Using a different clean, wet washcloth, thoroughly rinse your entire body. Make sure that all body creases and crevices are rinsed well. 6. Dry off with a clean towel. Do not put any substances on your body afterward--such as powder, lotion, or perfume--unless you are told to do so by your health care provider. Only use lotions that are recommended by the manufacturer. 7. Put on clean clothes or pajamas. 8. If it is the night before your surgery, sleep in clean sheets. How to use CHG prepackaged cloths  Only use CHG cloths as told by your health care provider, and follow the instructions on the label.  Use the CHG cloth on clean, dry skin.  Do not use the CHG cloth on your head or face unless your health care provider tells you to.  When washing with the CHG cloth: ? Avoid your genital area. ? Avoid any areas of skin that have broken skin, cuts, or scrapes. Before surgery Follow these steps when using a CHG cloth to clean before surgery (unless your health care provider gives you different instructions): 1. Using the CHG cloth, vigorously scrub the part of your body where you will be having surgery. Scrub using a back-and-forth motion for 3 minutes. The area on your body should be completely wet with CHG when you are done scrubbing. 2. Do not rinse. Discard the cloth and let the area air-dry. Do not put any substances on the area afterward, such as powder, lotion, or perfume. 3. Put on clean clothes or pajamas. 4. If it is the night before your surgery, sleep in clean sheets.  For general bathing Follow these steps when using CHG cloths for general bathing (unless your health care provider gives you different instructions). 1. Use a separate CHG cloth for each area of your body. Make sure you wash between any folds of skin and between your fingers and toes. Wash your body in the following order, switching to a new cloth after each  step: ? The front of your neck, shoulders, and chest. ? Both of your arms, under your arms, and your hands. ? Your stomach and groin area, avoiding the genitals. ? Your right leg and foot. ? Your left leg and foot. ? The back of your neck, your back, and your buttocks. 2. Do not rinse. Discard the cloth and let the area air-dry. Do not put any substances on your body afterward--such as powder, lotion, or perfume--unless you are told to do so by your health care provider. Only use lotions that are recommended by the manufacturer. 3. Put on clean clothes or pajamas. Contact a health care provider if:  Your skin gets irritated after scrubbing.  You have questions about using your solution or cloth. Get help right away if:  Your eyes become very red or swollen.  Your eyes itch badly.  Your skin itches badly and is red or swollen.  Your hearing changes.  You have trouble seeing.  You have swelling or tingling in your mouth or throat.  You have trouble breathing.  You swallow any chlorhexidine. Summary  Chlorhexidine gluconate (CHG) is a germ-killing (antiseptic) solution that is used to clean the skin. Cleaning your skin with CHG may help to lower your risk for infection.  You may be given CHG to use for bathing. It may be in a bottle or in a prepackaged cloth to use on your skin. Carefully follow your health care provider's instructions and the instructions on the product label.  Do not use CHG if you have a chlorhexidine allergy.  Contact your health care provider if your skin gets irritated after scrubbing. This information is not intended to replace advice given to you by your health care provider. Make sure you discuss any questions you have with your health care provider. Document Revised: 03/24/2018 Document Reviewed: 12/03/2016 Elsevier Patient Education  [place].

## 2019-04-07 ENCOUNTER — Other Ambulatory Visit: Payer: Self-pay | Admitting: Obstetrics and Gynecology

## 2019-04-07 ENCOUNTER — Other Ambulatory Visit (HOSPITAL_COMMUNITY): Payer: Medicaid Other

## 2019-04-07 ENCOUNTER — Encounter (HOSPITAL_COMMUNITY)
Admission: RE | Admit: 2019-04-07 | Discharge: 2019-04-07 | Disposition: A | Discharge status: Home / Self Care | Payer: Medicaid Other | Source: Ambulatory Visit | Attending: Obstetrics and Gynecology | Admitting: Obstetrics and Gynecology

## 2019-04-07 ENCOUNTER — Encounter (HOSPITAL_COMMUNITY): Payer: Self-pay

## 2019-04-07 ENCOUNTER — Other Ambulatory Visit (HOSPITAL_COMMUNITY)
Admission: RE | Admit: 2019-04-07 | Discharge: 2019-04-07 | Disposition: A | Discharge status: Home / Self Care | Payer: Medicaid Other | Source: Ambulatory Visit | Attending: Obstetrics and Gynecology | Admitting: Obstetrics and Gynecology

## 2019-04-07 ENCOUNTER — Other Ambulatory Visit: Payer: Self-pay

## 2019-04-07 DIAGNOSIS — Z20822 Contact with and (suspected) exposure to covid-19: Secondary | ICD-10-CM | POA: Diagnosis not present

## 2019-04-07 DIAGNOSIS — Z01812 Encounter for preprocedural laboratory examination: Secondary | ICD-10-CM | POA: Diagnosis not present

## 2019-04-07 HISTORY — DX: Anxiety disorder, unspecified: F41.9

## 2019-04-07 HISTORY — DX: Attention-deficit hyperactivity disorder, unspecified type: F90.9

## 2019-04-07 LAB — URINALYSIS, ROUTINE W REFLEX MICROSCOPIC
Bilirubin Urine: NEGATIVE
Glucose, UA: NEGATIVE mg/dL
Hgb urine dipstick: NEGATIVE
Ketones, ur: NEGATIVE mg/dL
Nitrite: NEGATIVE
Protein, ur: NEGATIVE mg/dL
Specific Gravity, Urine: 1.015 (ref 1.005–1.030)
pH: 6 (ref 5.0–8.0)

## 2019-04-07 LAB — CBC
HCT: 43 % (ref 36.0–46.0)
Hemoglobin: 14.4 g/dL (ref 12.0–15.0)
MCH: 31.6 pg (ref 26.0–34.0)
MCHC: 33.5 g/dL (ref 30.0–36.0)
MCV: 94.3 fL (ref 80.0–100.0)
Platelets: 235 10*3/uL (ref 150–400)
RBC: 4.56 MIL/uL (ref 3.87–5.11)
RDW: 13.7 % (ref 11.5–15.5)
WBC: 10.4 10*3/uL (ref 4.0–10.5)
nRBC: 0 % (ref 0.0–0.2)

## 2019-04-07 LAB — COMPREHENSIVE METABOLIC PANEL
ALT: 20 U/L (ref 0–44)
AST: 24 U/L (ref 15–41)
Albumin: 3.7 g/dL (ref 3.5–5.0)
Alkaline Phosphatase: 49 U/L (ref 38–126)
Anion gap: 6 (ref 5–15)
BUN: 14 mg/dL (ref 6–20)
CO2: 22 mmol/L (ref 22–32)
Calcium: 8.6 mg/dL — ABNORMAL LOW (ref 8.9–10.3)
Chloride: 109 mmol/L (ref 98–111)
Creatinine, Ser: 0.64 mg/dL (ref 0.44–1.00)
GFR calc Af Amer: >60 mL/min (ref >60)
GFR calc non Af Amer: >60 mL/min (ref >60)
Glucose, Bld: 86 mg/dL (ref 70–99)
Potassium: 3.9 mmol/L (ref 3.5–5.1)
Sodium: 137 mmol/L (ref 135–145)
Total Bilirubin: 0.4 mg/dL (ref 0.3–1.2)
Total Protein: 6.6 g/dL (ref 6.5–8.1)

## 2019-04-07 LAB — HCG, SERUM, QUALITATIVE: Preg, Serum: NEGATIVE

## 2019-04-08 LAB — SARS CORONAVIRUS 2 (TAT 6-24 HRS): SARS Coronavirus 2: NEGATIVE

## 2019-04-11 ENCOUNTER — Encounter (HOSPITAL_COMMUNITY)
Admission: RE | Disposition: A | Discharge status: Home / Self Care | Payer: Self-pay | Source: Ambulatory Visit | Attending: Obstetrics and Gynecology

## 2019-04-11 ENCOUNTER — Ambulatory Visit (HOSPITAL_COMMUNITY): Payer: Medicaid Other | Admitting: Anesthesiology

## 2019-04-11 ENCOUNTER — Ambulatory Visit (HOSPITAL_COMMUNITY)
Admission: RE | Admit: 2019-04-11 | Discharge: 2019-04-11 | Disposition: A | Discharge status: Home / Self Care | Payer: Medicaid Other | Source: Ambulatory Visit | Attending: Obstetrics and Gynecology | Admitting: Obstetrics and Gynecology

## 2019-04-11 DIAGNOSIS — F419 Anxiety disorder, unspecified: Secondary | ICD-10-CM | POA: Insufficient documentation

## 2019-04-11 DIAGNOSIS — Z640 Problems related to unwanted pregnancy: Secondary | ICD-10-CM | POA: Insufficient documentation

## 2019-04-11 DIAGNOSIS — N9989 Other postprocedural complications and disorders of genitourinary system: Secondary | ICD-10-CM | POA: Diagnosis not present

## 2019-04-11 DIAGNOSIS — Z302 Encounter for sterilization: Secondary | ICD-10-CM | POA: Insufficient documentation

## 2019-04-11 DIAGNOSIS — Z9851 Tubal ligation status: Secondary | ICD-10-CM

## 2019-04-11 DIAGNOSIS — F17210 Nicotine dependence, cigarettes, uncomplicated: Secondary | ICD-10-CM | POA: Diagnosis not present

## 2019-04-11 HISTORY — PX: LAPAROSCOPIC BILATERAL SALPINGECTOMY: SHX5889

## 2019-04-11 SURGERY — SALPINGECTOMY, BILATERAL, LAPAROSCOPIC
Anesthesia: General | Laterality: Bilateral

## 2019-04-11 MED ORDER — FENTANYL CITRATE (PF) 100 MCG/2ML IJ SOLN
INTRAMUSCULAR | Status: AC
  Filled 2019-04-11: qty 2

## 2019-04-11 MED ORDER — SUCCINYLCHOLINE CHLORIDE 200 MG/10ML IV SOSY
PREFILLED_SYRINGE | INTRAVENOUS | Status: AC
  Filled 2019-04-11: qty 10

## 2019-04-11 MED ORDER — ROCURONIUM BROMIDE 100 MG/10ML IV SOLN
INTRAVENOUS | Status: DC | PRN
  Administered 2019-04-11: 45 mg via INTRAVENOUS
  Administered 2019-04-11: 5 mg via INTRAVENOUS

## 2019-04-11 MED ORDER — MIDAZOLAM HCL 2 MG/2ML IJ SOLN
INTRAMUSCULAR | Status: AC
  Filled 2019-04-11: qty 2

## 2019-04-11 MED ORDER — PROPOFOL 10 MG/ML IV BOLUS
INTRAVENOUS | Status: DC | PRN
  Administered 2019-04-11: 200 mg via INTRAVENOUS

## 2019-04-11 MED ORDER — KETOROLAC TROMETHAMINE 30 MG/ML IJ SOLN
INTRAMUSCULAR | Status: AC
  Filled 2019-04-11: qty 1

## 2019-04-11 MED ORDER — DEXAMETHASONE SODIUM PHOSPHATE 10 MG/ML IJ SOLN
INTRAMUSCULAR | Status: AC
  Filled 2019-04-11: qty 1

## 2019-04-11 MED ORDER — LACTATED RINGERS IV SOLN: INTRAVENOUS | Status: DC

## 2019-04-11 MED ORDER — PROPOFOL 10 MG/ML IV BOLUS
INTRAVENOUS | Status: AC
  Filled 2019-04-11: qty 20

## 2019-04-11 MED ORDER — LIDOCAINE 2% (20 MG/ML) 5 ML SYRINGE
INTRAMUSCULAR | Status: AC
  Filled 2019-04-11: qty 5

## 2019-04-11 MED ORDER — SUGAMMADEX SODIUM 500 MG/5ML IV SOLN
INTRAVENOUS | Status: AC
  Filled 2019-04-11: qty 5

## 2019-04-11 MED ORDER — KETOROLAC TROMETHAMINE 30 MG/ML IJ SOLN
INTRAMUSCULAR | Status: DC | PRN
  Administered 2019-04-11: 30 mg via INTRAVENOUS

## 2019-04-11 MED ORDER — HYDROMORPHONE HCL 1 MG/ML IJ SOLN
0.2500 mg | INTRAMUSCULAR | Status: DC | PRN
  Administered 2019-04-11 (×2): 0.5 mg via INTRAVENOUS
  Filled 2019-04-11 (×2): qty 0.5

## 2019-04-11 MED ORDER — ONDANSETRON HCL 4 MG/2ML IJ SOLN
INTRAMUSCULAR | Status: DC | PRN
  Administered 2019-04-11: 4 mg via INTRAVENOUS

## 2019-04-11 MED ORDER — MIDAZOLAM HCL 5 MG/5ML IJ SOLN
INTRAMUSCULAR | Status: DC | PRN
  Administered 2019-04-11: 2 mg via INTRAVENOUS

## 2019-04-11 MED ORDER — SUCCINYLCHOLINE CHLORIDE 200 MG/10ML IV SOSY
PREFILLED_SYRINGE | INTRAVENOUS | Status: DC | PRN
  Administered 2019-04-11: 120 mg via INTRAVENOUS

## 2019-04-11 MED ORDER — ROCURONIUM BROMIDE 10 MG/ML (PF) SYRINGE
PREFILLED_SYRINGE | INTRAVENOUS | Status: AC
  Filled 2019-04-11: qty 10

## 2019-04-11 MED ORDER — SUGAMMADEX SODIUM 500 MG/5ML IV SOLN
INTRAVENOUS | Status: DC | PRN
  Administered 2019-04-11: 500 mg via INTRAVENOUS

## 2019-04-11 MED ORDER — FENTANYL CITRATE (PF) 100 MCG/2ML IJ SOLN
INTRAMUSCULAR | Status: DC | PRN
  Administered 2019-04-11 (×3): 100 ug via INTRAVENOUS

## 2019-04-11 MED ORDER — BUPIVACAINE-EPINEPHRINE (PF) 0.5% -1:200000 IJ SOLN
INTRAMUSCULAR | Status: AC
  Filled 2019-04-11: qty 30

## 2019-04-11 MED ORDER — 0.9 % SODIUM CHLORIDE (POUR BTL) OPTIME
TOPICAL | Status: DC | PRN
  Administered 2019-04-11: 1000 mL

## 2019-04-11 MED ORDER — LIDOCAINE HCL (CARDIAC) PF 100 MG/5ML IV SOSY
PREFILLED_SYRINGE | INTRAVENOUS | Status: DC | PRN
  Administered 2019-04-11: 60 mg via INTRAVENOUS

## 2019-04-11 MED ORDER — ONDANSETRON HCL 4 MG/2ML IJ SOLN
INTRAMUSCULAR | Status: AC
  Filled 2019-04-11: qty 2

## 2019-04-11 MED ORDER — DEXAMETHASONE SODIUM PHOSPHATE 10 MG/ML IJ SOLN
INTRAMUSCULAR | Status: DC | PRN
  Administered 2019-04-11: 10 mg via INTRAVENOUS

## 2019-04-11 SURGICAL SUPPLY — 43 items
BLADE SURG SZ11 CARB STEEL (BLADE) ×3 IMPLANT
BNDG ADH 1X3 FABRIC TAN LF (GAUZE/BANDAGES/DRESSINGS) ×9 IMPLANT
BNDG ADH 3X1 STRCH TAN LF (GAUZE/BANDAGES/DRESSINGS) ×3
CATH ROBINSON RED A/P 16FR (CATHETERS) ×2 IMPLANT
CLOSURE WOUND 1/4 X3 (GAUZE/BANDAGES/DRESSINGS) ×1
CLOTH BEACON ORANGE TIMEOUT ST (SAFETY) ×3 IMPLANT
COVER LIGHT HANDLE STERIS (MISCELLANEOUS) ×6 IMPLANT
COVER WAND RF STERILE (DRAPES) ×3 IMPLANT
DECANTER SPIKE VIAL GLASS SM (MISCELLANEOUS) ×3 IMPLANT
DURAPREP 26ML APPLICATOR (WOUND CARE) ×3 IMPLANT
ELECT REM PT RETURN 9FT ADLT (ELECTROSURGICAL) ×3
ELECTRODE REM PT RTRN 9FT ADLT (ELECTROSURGICAL) ×1 IMPLANT
GAUZE 4X4 16PLY RFD (DISPOSABLE) ×3 IMPLANT
GLOVE BIOGEL PI IND STRL 7.0 (GLOVE) ×3 IMPLANT
GLOVE BIOGEL PI IND STRL 9 (GLOVE) ×1 IMPLANT
GLOVE BIOGEL PI INDICATOR 7.0 (GLOVE) ×6
GLOVE BIOGEL PI INDICATOR 9 (GLOVE) ×2
GLOVE ECLIPSE 6.5 STRL STRAW (GLOVE) ×4 IMPLANT
GLOVE ECLIPSE 9.0 STRL (GLOVE) ×6 IMPLANT
GOWN SPEC L3 XXLG W/TWL (GOWN DISPOSABLE) ×3 IMPLANT
GOWN STRL REUS W/TWL LRG LVL3 (GOWN DISPOSABLE) ×5 IMPLANT
INST SET LAPROSCOPIC GYN AP (KITS) ×3 IMPLANT
KIT TURNOVER KIT A (KITS) ×3 IMPLANT
NDL HYPO 25X1 1.5 SAFETY (NEEDLE) ×1 IMPLANT
NEEDLE HYPO 25X1 1.5 SAFETY (NEEDLE) ×3 IMPLANT
NEEDLE INSUFFLATION 120MM (ENDOMECHANICALS) ×3 IMPLANT
NS IRRIG 1000ML POUR BTL (IV SOLUTION) ×3 IMPLANT
PACK PERI GYN (CUSTOM PROCEDURE TRAY) ×3 IMPLANT
PAD ARMBOARD 7.5X6 YLW CONV (MISCELLANEOUS) ×3 IMPLANT
SET BASIN LINEN APH (SET/KITS/TRAYS/PACK) ×3 IMPLANT
SET TUBE SMOKE EVAC HIGH FLOW (TUBING) ×3 IMPLANT
SHEARS HARMONIC ACE PLUS 36CM (ENDOMECHANICALS) ×3 IMPLANT
SLEEVE ENDOPATH XCEL 5M (ENDOMECHANICALS) ×3 IMPLANT
SOL ANTI FOG 6CC (MISCELLANEOUS) ×1 IMPLANT
SOLUTION ANTI FOG 6CC (MISCELLANEOUS) ×2
STRIP CLOSURE SKIN 1/4X3 (GAUZE/BANDAGES/DRESSINGS) ×2 IMPLANT
SUT VIC AB 4-0 PS2 27 (SUTURE) ×3 IMPLANT
SYR 10ML LL (SYRINGE) ×3 IMPLANT
SYR BULB IRRIGATION 50ML (SYRINGE) ×3 IMPLANT
SYR CONTROL 10ML LL (SYRINGE) ×3 IMPLANT
TROCAR XCEL NON BLADE 8MM B8LT (ENDOMECHANICALS) ×3 IMPLANT
TROCAR XCEL NON-BLD 5MMX100MML (ENDOMECHANICALS) ×3 IMPLANT
WARMER LAPAROSCOPE (MISCELLANEOUS) ×3 IMPLANT

## 2019-04-11 NOTE — Anesthesia Preprocedure Evaluation (Signed)
Anesthesia Evaluation  Patient identified by MRN, date of birth, ID band Patient awake    Reviewed: Allergy & Precautions, H&P , NPO status , Patient's Chart, lab work & pertinent test results, reviewed documented beta blocker date and time   Airway Mallampati: II  TM Distance: >3 FB Neck ROM: full    Dental  (+) Upper Dentures   Pulmonary Current Smoker,    Pulmonary exam normal        Cardiovascular negative cardio ROS Normal cardiovascular exam     Neuro/Psych PSYCHIATRIC DISORDERS Anxiety negative neurological ROS     GI/Hepatic negative GI ROS, Neg liver ROS,   Endo/Other  negative endocrine ROS  Renal/GU negative Renal ROS  negative genitourinary   Musculoskeletal   Abdominal   Peds  Hematology negative hematology ROS (+)   Anesthesia Other Findings   Reproductive/Obstetrics negative OB ROS                             Anesthesia Physical Anesthesia Plan  ASA: II  Anesthesia Plan: General   Post-op Pain Management:    Induction:   PONV Risk Score and Plan: 2 and Ondansetron  Airway Management Planned:   Additional Equipment:   Intra-op Plan:   Post-operative Plan:   Informed Consent: I have reviewed the patients History and Physical, chart, labs and discussed the procedure including the risks, benefits and alternatives for the proposed anesthesia with the patient or authorized representative who has indicated his/her understanding and acceptance.       Plan Discussed with: CRNA  Anesthesia Plan Comments:         Anesthesia Quick Evaluation

## 2019-04-11 NOTE — Anesthesia Postprocedure Evaluation (Signed)
Anesthesia Post Note  Patient: Natalie Paul  Procedure(s) Performed: LAPAROSCOPIC BILATERAL SALPINGECTOMY (Bilateral )  Patient location during evaluation: PACU Anesthesia Type: General Level of consciousness: awake and alert, awake, oriented and patient cooperative Pain management: pain level controlled Vital Signs Assessment: post-procedure vital signs reviewed and stable Respiratory status: spontaneous breathing, respiratory function stable and nonlabored ventilation Cardiovascular status: stable Postop Assessment: no apparent nausea or vomiting Anesthetic complications: no     Last Vitals:  Vitals:   04/11/19 0719 04/11/19 0721  BP:  120/75  Pulse: 78   Resp: 18   Temp: 36.8 C   SpO2: 97%     Last Pain:  Vitals:   04/11/19 0719  TempSrc: Oral  PainSc: 0-No pain                 Jorian Willhoite

## 2019-04-11 NOTE — Brief Op Note (Addendum)
04/11/2019  8:43 AM  PATIENT:  Natalie Paul  39 y.o. female  PRE-OPERATIVE DIAGNOSIS: Laparoscopic sterilization/history of failed postpartum bilateral tubal ligation   POST-OPERATIVE DIAGNOSIS: Laparoscopic sterilization/history of failed postpartum bilateral tubal ligation   PROCEDURE:  Procedure(s): LAPAROSCOPIC BILATERAL SALPINGECTOMY (Bilateral)  SURGEON:  Surgeon(s) and Role:    Jonnie Kind, MD - Primary  PHYSICIAN ASSISTANT:   ASSISTANTS: Zoila Shutter, CST  ANESTHESIA:   general  EBL:  5 mL   BLOOD ADMINISTERED:none  DRAINS: none   LOCAL MEDICATIONS USED:  NONE  SPECIMEN:  Source of Specimen:  Bilateral fallopian tubes and Filshie clips  DISPOSITION OF SPECIMEN:  PATHOLOGY  COUNTS:  YES  TOURNIQUET:  * No tourniquets in log *  DICTATION: .Dragon Dictation  PLAN OF CARE: Discharge to home after PACU  PATIENT DISPOSITION:  PACU - hemodynamically stable.   Delay start of Pharmacological VTE agent (>24hrs) due to surgical blood loss or risk of bleeding: not applicable   Indications: 39 year old female 1-1/2 years status post vaginal delivery and postpartum tubal sterilization with positive pregnancy test and miscarriage occurring a few months after her original sterilization.  Details of procedure: Patient was taken the operating room prepped and draped for abdominal procedure with legs in low lithotomy support.  Bladder was in and out catheterized and vaginal area prepped, timeout was conducted and procedure confirmed by surgical team.  Draping was performed.  Speculum was used to insert a ring forceps with gauze in the vagina for manipulation of the uterus.  Attention was then directed to the abdomen after changing gloves, an infraumbilical vertical 1 cm skin incision was made, with hemostat used to stay close to the umbilical stalk.  The abdomen was elevated retracted inferiorly, and Veress needle inserted without difficulty feeling the 2 pops expected and  water droplet technique used to identify easy flow into the abdominal cavity.  Insufflation was performed under 8 mm intra-abdominal pressure to 3 L without difficulty and then the 5 mm laparoscopic trocar was inserted under direct visualization through the umbilicus and photo taken to confirm normal-appearing bowel surfaces.  Suprapubic and right lower quadrant trochars were inserted under direct visualization and attention then directed to the pelvis with.  With the patient in Trendelenburg position the fallopian tubes could be easily identified.  The findings showed a Filshie clip on the left side that appeared to be crossing the fallopian tube but there was kind of a bridge over the surface of the Filshie clip.  There was some distal fimbrial adhesions.  On the patient right side there is a suggestion of a hydrosalpinx on the distal portion of the tube, and the Filshie clip was hanging freely, but the tube appeared disrupted.  Tubal dye studies were not performed so it was impossible to comment as to whether there was some microscopic opening on the right side.  \Harmonic a 7 device was then used to perform salpingectomy on the right side and extracted the tube and clipped through the suprapubic site.  A similar procedure was performed on the patient's left side removing the entire fallopian tube and coagulating right at the cornual area of the tubal insertion into the uterine body. 120 cc of saline was instilled in the abdomen, the laparoscopic trochars removed without difficulty gas expelled, trochars removed, and then infraumbilical and lower quadrant incisions were closed using subcutaneous 4-0 Vicryl.  Sponge and needle counts were correct.  Patient to recovery room in stable condition  Report given to family  member by phone.Marland Kitchen

## 2019-04-11 NOTE — Op Note (Signed)
04/11/2019  8:43 AM  PATIENT:  Natalie Paul  39 y.o. female  PRE-OPERATIVE DIAGNOSIS: Laparoscopic sterilization/history of failed postpartum bilateral tubal ligation   POST-OPERATIVE DIAGNOSIS: Laparoscopic sterilization/history of failed postpartum bilateral tubal ligation   PROCEDURE:  Procedure(s): LAPAROSCOPIC BILATERAL SALPINGECTOMY (Bilateral)  SURGEON:  Surgeon(s) and Role:    * Ho Parisi V, MD - Primary  PHYSICIAN ASSISTANT:   ASSISTANTS: Angela Witt, CST  ANESTHESIA:   general  EBL:  5 mL   BLOOD ADMINISTERED:none  DRAINS: none   LOCAL MEDICATIONS USED:  NONE  SPECIMEN:  Source of Specimen:  Bilateral fallopian tubes and Filshie clips  DISPOSITION OF SPECIMEN:  PATHOLOGY  COUNTS:  YES  TOURNIQUET:  * No tourniquets in log *  DICTATION: .Dragon Dictation  PLAN OF CARE: Discharge to home after PACU  PATIENT DISPOSITION:  PACU - hemodynamically stable.   Delay start of Pharmacological VTE agent (>24hrs) due to surgical blood loss or risk of bleeding: not applicable   Indications: 39-year-old female 1-1/2 years status post vaginal delivery and postpartum tubal sterilization with positive pregnancy test and miscarriage occurring a few months after her original sterilization.  Details of procedure: Patient was taken the operating room prepped and draped for abdominal procedure with legs in low lithotomy support.  Bladder was in and out catheterized and vaginal area prepped, timeout was conducted and procedure confirmed by surgical team.  Draping was performed.  Speculum was used to insert a ring forceps with gauze in the vagina for manipulation of the uterus.  Attention was then directed to the abdomen after changing gloves, an infraumbilical vertical 1 cm skin incision was made, with hemostat used to stay close to the umbilical stalk.  The abdomen was elevated retracted inferiorly, and Veress needle inserted without difficulty feeling the 2 pops expected and  water droplet technique used to identify easy flow into the abdominal cavity.  Insufflation was performed under 8 mm intra-abdominal pressure to 3 L without difficulty and then the 5 mm laparoscopic trocar was inserted under direct visualization through the umbilicus and photo taken to confirm normal-appearing bowel surfaces.  Suprapubic and right lower quadrant trochars were inserted under direct visualization and attention then directed to the pelvis with.  With the patient in Trendelenburg position the fallopian tubes could be easily identified.  The findings showed a Filshie clip on the left side that appeared to be crossing the fallopian tube but there was kind of a bridge over the surface of the Filshie clip.  There was some distal fimbrial adhesions.  On the patient right side there is a suggestion of a hydrosalpinx on the distal portion of the tube, and the Filshie clip was hanging freely, but the tube appeared disrupted.  Tubal dye studies were not performed so it was impossible to comment as to whether there was some microscopic opening on the right side.  \Harmonic a 7 device was then used to perform salpingectomy on the right side and extracted the tube and clipped through the suprapubic site.  A similar procedure was performed on the patient's left side removing the entire fallopian tube and coagulating right at the cornual area of the tubal insertion into the uterine body. 120 cc of saline was instilled in the abdomen, the laparoscopic trochars removed without difficulty gas expelled, trochars removed, and then infraumbilical and lower quadrant incisions were closed using subcutaneous 4-0 Vicryl.  Sponge and needle counts were correct.  Patient to recovery room in stable condition  Report given to family   member by phone.. 

## 2019-04-11 NOTE — Discharge Instructions (Signed)
Laparoscopic Tubal Ligation, Care After This sheet gives you information about how to care for yourself after your procedure. Your health care provider may also give you more specific instructions. If you have problems or questions, contact your health care provider. What can I expect after the procedure? After the procedure, it is common to have:  A sore throat.  Discomfort in your shoulder.  Mild discomfort or cramping in your abdomen.  Gas pains.  Pain or soreness in the area where the surgical incision was made.  A bloated feeling.  Tiredness.  Nausea.  Vomiting. Follow these instructions at home: Medicines  Take over-the-counter and prescription medicines only as told by your health care provider.  Do not take aspirin because it can cause bleeding.  Ask your health care provider if the medicine prescribed to you: ? Requires you to avoid driving or using heavy machinery. ? Can cause constipation. You may need to take actions to prevent or treat constipation, such as:  Drink enough fluid to keep your urine pale yellow.  Take over-the-counter or prescription medicines.  Eat foods that are high in fiber, such as beans, whole grains, and fresh fruits and vegetables.  Limit foods that are high in fat and processed sugars, such as fried or sweet foods. Incision care      Follow instructions from your health care provider about how to take care of your incision. Make sure you: ? Wash your hands with soap and water before and after you change your bandage (dressing). If soap and water are not available, use hand sanitizer. ? Change your dressing as told by your health care provider. ? Leave stitches (sutures), skin glue, or adhesive strips in place. These skin closures may need to stay in place for 2 weeks or longer. If adhesive strip edges start to loosen and curl up, you may trim the loose edges. Do not remove adhesive strips completely unless your health care provider  tells you to do that.  Check your incision area every day for signs of infection. Check for: ? Redness, swelling, or pain. ? Fluid or blood. ? Warmth. ? Pus or a bad smell. Activity  Rest as told by your health care provider.  Avoid sitting for a long time without moving. Get up to take short walks every 1-2 hours. This is important to improve blood flow and breathing. Ask for help if you feel weak or unsteady.  Return to your normal activities as told by your health care provider. Ask your health care provider what activities are safe for you. General instructions  Do not take baths, swim, or use a hot tub until your health care provider approves. Ask your health care provider if you may take showers. You may only be allowed to take sponge baths.  Have someone help you with your daily household tasks for the first few days.  Keep all follow-up visits as told by your health care provider. This is important. Contact a health care provider if:  You have redness, swelling, or pain around your incision.  Your incision feels warm to the touch.  You have pus or a bad smell coming from your incision.  The edges of your incision break open after the sutures have been removed.  Your pain does not improve after 2-3 days.  You have a rash.  You repeatedly become dizzy or light-headed.  Your pain medicine is not helping. Get help right away if you:  Have a fever.  Faint.  Have increasing   pain in your abdomen.  Have severe pain in one or both of your shoulders.  Have fluid or blood coming from your sutures or from your vagina.  Have shortness of breath or difficulty breathing.  Have chest pain or leg pain.  Have ongoing nausea, vomiting, or diarrhea. Summary  After the procedure, it is common to have mild discomfort or cramping in your abdomen.  Take over-the-counter and prescription medicines only as told by your health care provider.  Watch for symptoms that should  prompt you to call your health care provider.  Keep all follow-up visits as told by your health care provider. This is important. This information is not intended to replace advice given to you by your health care provider. Make sure you discuss any questions you have with your health care provider. Document Revised: 06/14/2018 Document Reviewed: 11/30/2017 Elsevier Patient Education  2020 [employer]  

## 2019-04-11 NOTE — Transfer of Care (Signed)
Immediate Anesthesia Transfer of Care Note  Patient: PAULENA SERVAIS  Procedure(s) Performed: LAPAROSCOPIC BILATERAL SALPINGECTOMY (Bilateral )  Patient Location: PACU  Anesthesia Type:General  Level of Consciousness: awake, alert , oriented and patient cooperative  Airway & Oxygen Therapy: Patient Spontanous Breathing and Patient connected to face mask oxygen  Post-op Assessment: Report given to RN, Post -op Vital signs reviewed and stable and Patient moving all extremities X 4  Post vital signs: Reviewed and stable  Last Vitals:  Vitals Value Taken Time  BP    Temp    Pulse 39 04/11/19 0839  Resp 12 04/11/19 0840  SpO2 80 % 04/11/19 0839  Vitals shown include unvalidated device data.  Last Pain:  Vitals:   04/11/19 0719  TempSrc: Oral  PainSc: 0-No pain      Patients Stated Pain Goal: 9 (04/11/19 0719)  Complications: No apparent anesthesia complications

## 2019-04-11 NOTE — Anesthesia Procedure Notes (Signed)
Procedure Name: Intubation Date/Time: 04/11/2019 7:54 AM Performed by: Jonna Munro, CRNA Pre-anesthesia Checklist: Patient identified, Emergency Drugs available, Suction available, Patient being monitored and Timeout performed Patient Re-evaluated:Patient Re-evaluated prior to induction Oxygen Delivery Method: Circle system utilized Preoxygenation: Pre-oxygenation with 100% oxygen Induction Type: IV induction and Rapid sequence Laryngoscope Size: Mac and 3 Grade View: Grade I Tube type: Oral Number of attempts: 1 Airway Equipment and Method: Stylet Placement Confirmation: ETT inserted through vocal cords under direct vision,  positive ETCO2 and breath sounds checked- equal and bilateral Secured at: 22 cm Tube secured with: Tape Dental Injury: Teeth and Oropharynx as per pre-operative assessment

## 2019-04-11 NOTE — H&P (Signed)
Preoperative History and Physical  Natalie Paul is a 39 y.o. [id number] here for surgical management of contraception management.   No significant preoperative concerns. The patient had a prior tubal sterilization by a clip placement immediately postpartum after her last child in 2019 that was followed by a intrauterine pregnancy that resulted in miscarriage, indicating tubal failure.  The patient's been using barrier contraception since then when she used contraception.. She has reviewed the records and states that the surgery took 1.5 hours and clips were utilized.  My review of the records shows that Filshie clips were used. She reaffirms desire for permanent sterilization . proposed surgery: laparoscopic bilateral salpingectomy      Past Medical History:  Diagnosis Date  . Medical history non-contributory   . Substance abuse (HCC)   The patient has had a back injury in the past with no residual discomfort down either leg.  This is for her Suboxone opioid dependency initiated and she is now been doing well on Suboxone 8 mg twice daily for many years  The patient describes her periods as normal, no different prior to her last pregnancy and well-tolerated      Past Surgical History:  Procedure Laterality Date  . FRACTURE SURGERY    . TUBAL LIGATION Bilateral 10/28/2017   Procedure: POST PARTUM TUBAL LIGATION;  Surgeon: Hermina Staggers, MD;  Location: [facility] BIR[facility]ING SU[facility];  Service: Gynecology;  Laterality: Bilateral;                   OB History  Gravida Para Term Preterm AB Living  4 4 4     4   SAB TAB Ectopic Multiple Live Births           0 4       # Outcome Date GA Lbr Len/2nd Weight Sex Delivery Anes PTL Lv  4 Term 10/28/17 [redacted]w[redacted]d 03:35 / 00:07 7 lb 0.9 oz (3.2 kg) M Vag-Spont EPI  LIV  3 Term 10/25/07 [redacted]w[redacted]d  5 lb 7 oz (2.466 kg) F Vag-Spont None N LIV  2 Term 01/26/04 [redacted]w[redacted]d  6 lb 9 oz (2.977 kg) F Vag-Spont EPI N LIV  1 Term 04/05/01 [redacted]w[redacted]d  7 lb 10 oz (3.459 kg) F  Vag-Spont EPI N LIV  Patient denies any other pertinent gynecologic issues.         Current Outpatient Medications on File Prior to Visit  Medication Sig Dispense Refill  . buprenorphine (SUBUTEX) 8 MG SUBL SL tablet Place 8 mg under the tongue 2 (two) times daily.   0  . Pediatric Multiple Vit-C-FA (FLINSTONES GUMMIES OMEGA-3 DHA) CHEW Chew 2 each by mouth daily.     . SPRINTEC 28 0.25-35 MG-MCG tablet TAKE 1 TABLET BY MOUTH DAILY. 28 tablet 0  . [DISCONTINUED] norgestimate-ethinyl estradiol (ORTHO-CYCLEN,SPRINTEC,PREVIFEM) 0.25-35 MG-MCG tablet Take 1 tablet by mouth daily. 1 Package 11   No current facility-administered medications on file prior to visit.   No Known Allergies  Social History:   reports that she has been smoking cigarettes. She has been smoking about 0.50 packs per day. She has never used smokeless tobacco. She reports current alcohol use. She reports previous drug use. Drugs: Oxycodone, Hydrocodone, and Marijuana.       Family History  Problem Relation Age of Onset  . Cancer Other   . Cancer Maternal Grandmother   . Heart attack Maternal Grandfather   . Breast cancer Sister     Review of Systems: Noncontributory  PHYSICAL EXAM: BP 10-05-1998)  150/91 (BP Location: Right Arm, Patient Position: Sitting, Cuff Size: Normal)   Pulse 67   Ht 5\' 7"  (1.702 m)   Wt 185 lb (83.9 kg)   LMP 03/13/2019 (Exact Date)   BMI 28.98 kg/m   not currently breastfeeding. General appearance - alert, well appearing, and in no distress Chest - clear to auscultation, no wheezes, rales or rhonchi, symmetric air entry Heart - normal rate and regular rhythm Abdomen - soft, nontender, nondistended, no masses or organomegaly Pelvic -  VAGINA: normal appearing vagina with normal color and discharge, no lesions, PELVIC FLOOR EXAM: no cystocele, rectocele or prolapse noted, multiparous introitus, CERVIX: normal appearing cervix without discharge or lesions, UTERUS: uterus is  normal size, shape, consistency and nontender, anteverted, mobile, ADNEXA: normal adnexa in size, nontender and no masses.  Extremities - peripheral pulses normal, no pedal edema, no clubbing or cyanosis  Labs: CBC    Component Value Date/Time   WBC 10.4 04/07/2019 1535   RBC 4.56 04/07/2019 1535   HGB 14.4 04/07/2019 1535   HGB 13.1 07/29/2017 0916   HCT 43.0 04/07/2019 1535   HCT 38.8 07/29/2017 0916   PLT 235 04/07/2019 1535   PLT 204 07/29/2017 0916   MCV 94.3 04/07/2019 1535   MCV 93 07/29/2017 0916   MCH 31.6 04/07/2019 1535   MCHC 33.5 04/07/2019 1535   RDW 13.7 04/07/2019 1535   RDW 14.2 07/29/2017 0916   LYMPHSABS 1.9 05/04/2017 1454   EOSABS 0.2 05/04/2017 1454   BASOSABS 0.0 05/04/2017 1454   CMP Latest Ref Rng & Units 04/07/2019 06/04/2010  Glucose 70 - 99 mg/dL 86 104(H)  BUN 6 - 20 mg/dL 14 14  Creatinine 0.44 - 1.00 mg/dL 0.64 0.86  Sodium 135 - 145 mmol/L 137 136  Potassium 3.5 - 5.1 mmol/L 3.9 4.3  Chloride 98 - 111 mmol/L 109 102  CO2 22 - 32 mmol/L 22 26  Calcium 8.9 - 10.3 mg/dL 8.6(L) 9.8  Total Protein 6.5 - 8.1 g/dL 6.6 -  Total Bilirubin 0.3 - 1.2 mg/dL 0.4 -  Alkaline Phos 38 - 126 U/L 49 -  AST 15 - 41 U/L 24 -  ALT 0 - 44 U/L 20 -   HCG negative Sars /Coronavirus Negative. Imaging Studies: Imaging Results  No results found.    Assessment: 1.  Desire for elective permanent sterilization 2.  History of prior tubal sterilization with subsequent intrauterine pregnancy and miscarriage     Patient Active Problem List   Diagnosis Date Noted  . 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 Marijuana use 05/05/2017  . Smoker 05/04/2017  . History of substance abuse ([place]) 03/31/2017  . Metacarpal bone fracture 06/05/2010    Plan: Patient will undergo surgical management with  laparoscopic bilateral salpingectomy 04/11/2019.     By signing my name below, I, [name], attest that this documentation has been prepared under the direction and  in the presence of Jonnie Kind, MD. Electronically Signed: [place]. 03/28/19. 3:58 PM.  I personally performed the services described in this documentation, which was SCRIBED in my presence. The recorded information has been reviewed and considered accurate. It has been edited as necessary during review. Jonnie Kind, MD

## 2019-04-12 LAB — SURGICAL PATHOLOGY

## 2019-04-19 NOTE — Progress Notes (Signed)
Patient ID: Natalie Paul, female   DOB: 1980/12/22, 39 y.o.   MRN: [id number]   Subjective:  Natalie Paul is a 39 y.o. female now 1 weeks status post failed laparoscopic bilateral salpingectomy.        Review of Systems Negative except bruising around the right lower quadrant and suprapubic trocar sites with slight firmness over a 5 mm to 7 mm area   Diet:   reg   Bowel movements : normal.  The patient is not having any pain.  Objective:  BP 126/86 (BP Location: Left Arm, Patient Position: Sitting, Cuff Size: Normal)   Pulse 86   Ht 5\' 7"  (1.702 m)   Wt 186 lb 12.8 oz (84.7 kg)   Breastfeeding No   BMI 29.26 kg/m  General:Well developed, well nourished.  No acute distress. Abdomen: Bowel sounds normal, soft, non-tender. Pelvic Exam: N/A  Incision(s): Healing well, no drainage, no hernia, no swelling, no dehiscence. Mild bruising and no erythremia.       Assessment:  Post-Op 1 weeks s/p failed laparoscopic bilateral salpingectomy Regular tolerable postoperative bruising, healing well  I personally performed the services described in this documentation, which was SCRIBED in my presence. The recorded information has been reviewed and considered accurate. It has been edited as necessary during review. , MD     Plan:  1.Wound care discussed   2. . current medications. 3. Activity restrictions: none 4. return to work: now. 5. Follow up in a few Years, ages 29 and 43 for Pap smears.    This chart was scribed by 42, Medical Scribe, for Dr. Mal Misty on 04/20/19 at 8:40 AM. This chart was reviewed by Dr. 06/20/19 for accuracy.   personally performed the services described in this documentation, which was SCRIBED in my presence. The recorded information has been reviewed and considered accurate. It has been edited as necessary during review. Christin Bach, MD

## 2019-04-20 ENCOUNTER — Other Ambulatory Visit: Payer: Self-pay

## 2019-04-20 ENCOUNTER — Ambulatory Visit (INDEPENDENT_AMBULATORY_CARE_PROVIDER_SITE_OTHER): Payer: Medicaid Other | Admitting: Obstetrics and Gynecology

## 2019-04-20 ENCOUNTER — Encounter: Payer: Self-pay | Admitting: Obstetrics and Gynecology

## 2019-04-20 VITALS — BP 126/86 | HR 86 | Ht 67.0 in | Wt 186.8 lb

## 2019-04-20 DIAGNOSIS — Z09 Encounter for follow-up examination after completed treatment for conditions other than malignant neoplasm: Secondary | ICD-10-CM

## 2019-08-08 DIAGNOSIS — F419 Anxiety disorder, unspecified: Secondary | ICD-10-CM | POA: Diagnosis not present

## 2019-08-08 DIAGNOSIS — O039 Complete or unspecified spontaneous abortion without complication: Secondary | ICD-10-CM | POA: Diagnosis not present

## 2020-01-31 DIAGNOSIS — F419 Anxiety disorder, unspecified: Secondary | ICD-10-CM | POA: Diagnosis not present

## 2020-01-31 DIAGNOSIS — O039 Complete or unspecified spontaneous abortion without complication: Secondary | ICD-10-CM | POA: Diagnosis not present

## 2020-02-14 DIAGNOSIS — F419 Anxiety disorder, unspecified: Secondary | ICD-10-CM | POA: Diagnosis not present

## 2020-02-14 DIAGNOSIS — O039 Complete or unspecified spontaneous abortion without complication: Secondary | ICD-10-CM | POA: Diagnosis not present

## 2020-03-06 DIAGNOSIS — O039 Complete or unspecified spontaneous abortion without complication: Secondary | ICD-10-CM | POA: Diagnosis not present

## 2020-03-06 DIAGNOSIS — F419 Anxiety disorder, unspecified: Secondary | ICD-10-CM | POA: Diagnosis not present

## 2020-03-09 IMAGING — US ULTRASOUND RIGHT BREAST LIMITED
1 series · 7 of 7 positions shown · non-contrast
Comparison: Baseline mammogram dated 06/15/2018.

CLINICAL DATA: Screening recall for possible right breast
asymmetry. Patient's sister was recently diagnosed with breast
cancer at age 43.

EXAM:
DIGITAL DIAGNOSTIC UNILATERAL RIGHT MAMMOGRAM WITH CAD AND TOMO
RIGHT BREAST ULTRASOUND

[Series 1: ultrasound right breast limited · 0.06mm/px · 7 of 7 slices shown]
[im 1/7]
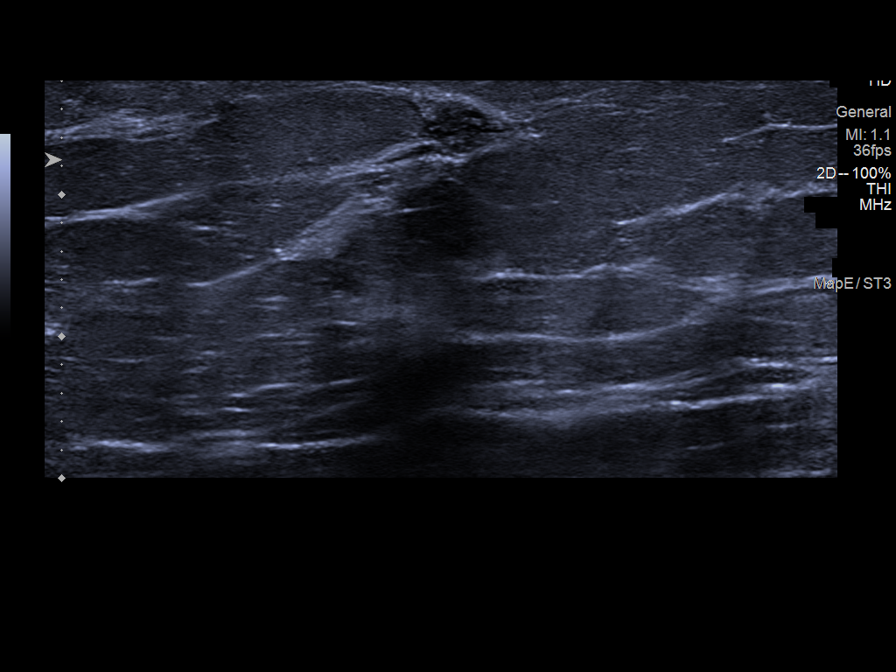
[im 2/7]
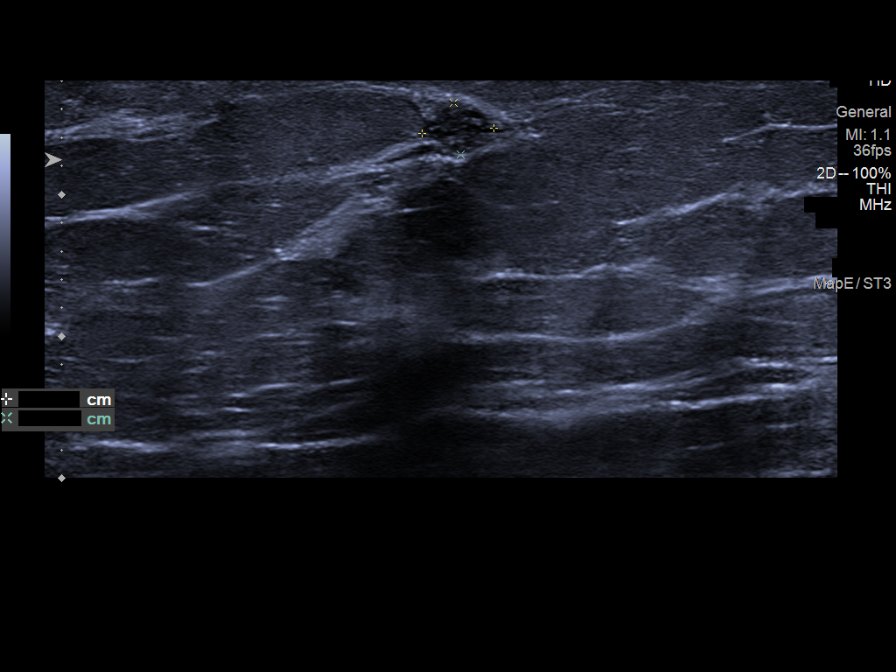
[im 3/7]
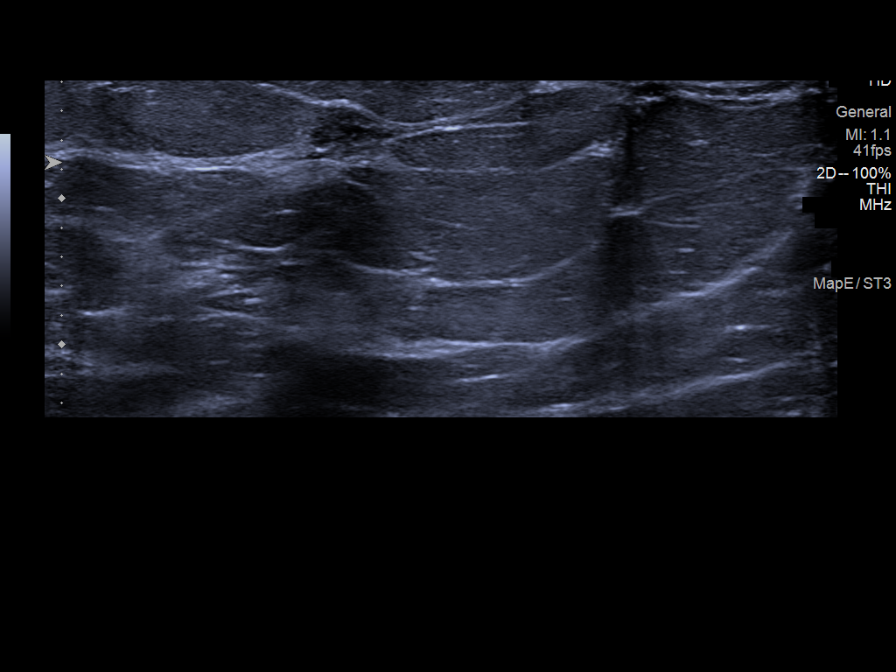
[im 4/7]
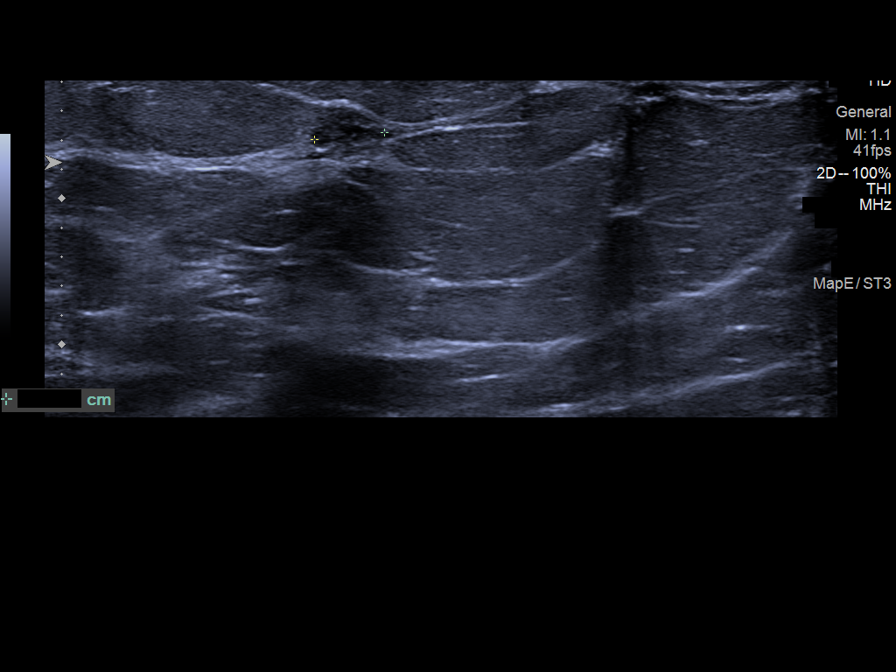
[im 5/7]
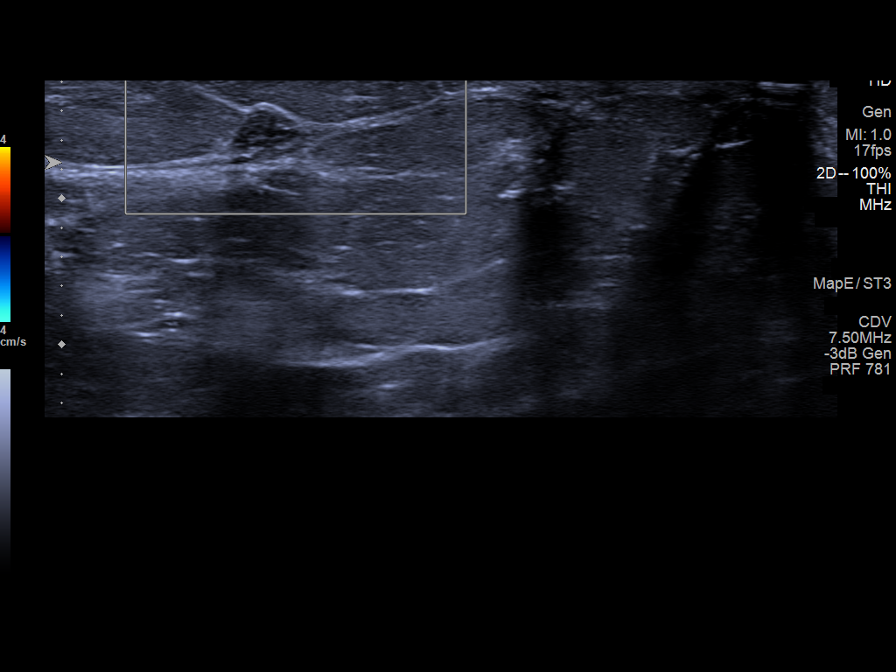
[im 6/7]
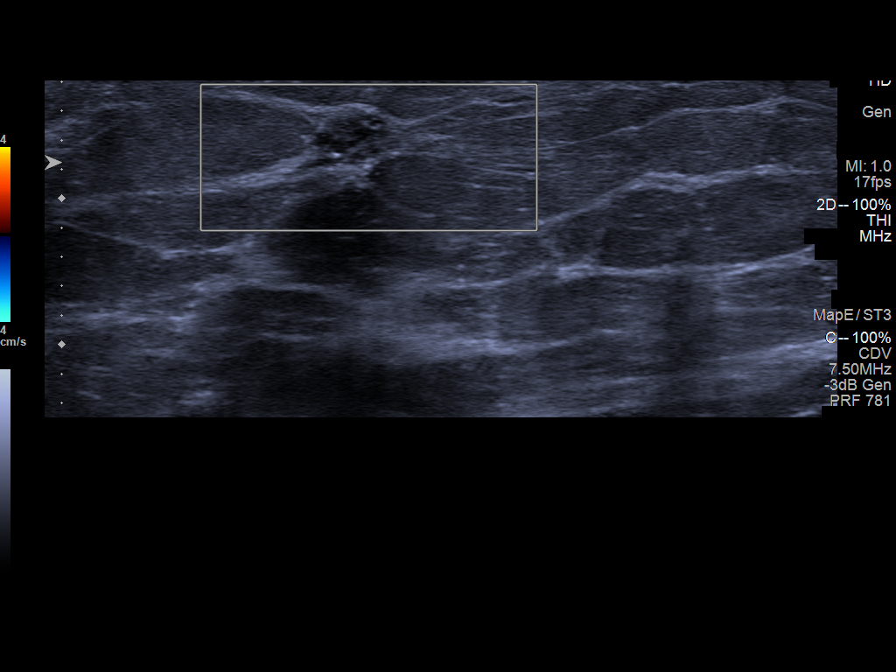
[im 7/7]
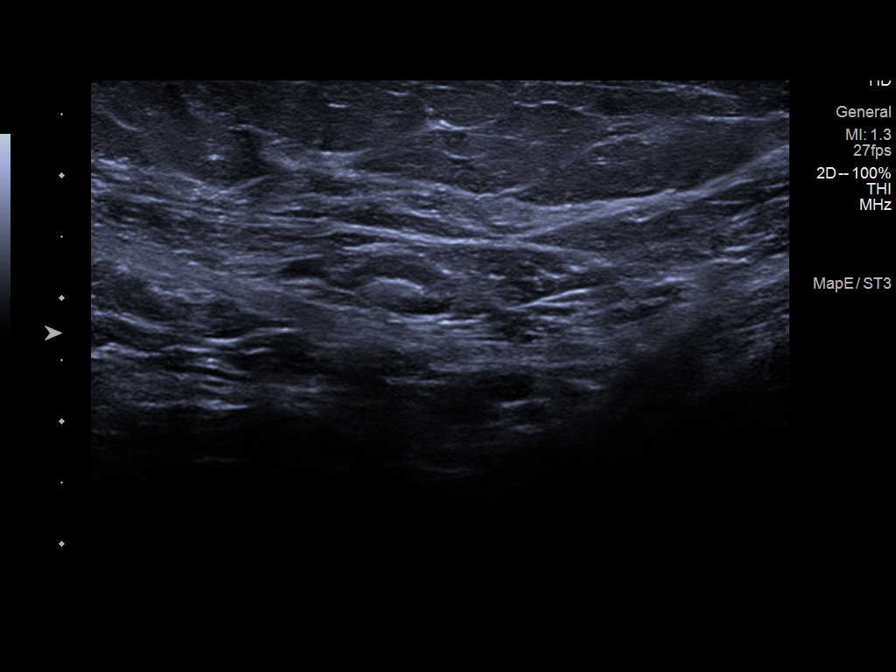

[7 of 7 positions shown; findings below may reference images not displayed]

ACR Breast Density Category b: There are scattered areas of
fibroglandular density.
FINDINGS: Additional tomograms were performed of the right breast. While the
initially questioned asymmetry appears to persist on the spot
compression MLO tomograms, it resolves and is no longer identified
on the full view ML tomograms suggesting of an area of overlapping
fibroglandular tissue.

Mammographic images were processed with CAD.

Targeted ultrasound of the entire upper central and outer right
breast was performed. There is an oval hypoechoic mass with slight
margin irregularity at 12 o'clock 1 cm from the nipple measuring
x 0.4 x 0.5 cm. This may correspond with the initially questioned
asymmetry seen on mammography. No lymphadenopathy seen in the right
axilla.
IMPRESSION: Indeterminate mass in the right breast at the 12 o'clock position.

RECOMMENDATION:
Ultrasound-guided biopsy of the mass in the right breast at the 12
o'clock position is recommended. If the biopsy clip does not
correspond with the asymmetry initially questioned on screening
mammography, then a 6 month follow-up diagnostic mammogram of the
right breast would be recommended.

I have discussed the findings and recommendations with the patient.
Results were also provided in writing at the conclusion of the
visit. If applicable, a reminder letter will be sent to the patient
regarding the next appointment.

BI-RADS CATEGORY  4: Suspicious.

## 2020-03-09 IMAGING — MG DIGITAL DIAGNOSTIC UNILATERAL RIGHT MAMMOGRAM WITH TOMO AND CAD
4 series · 4 of 12 positions shown · non-contrast
Comparison: Baseline mammogram dated 06/15/2018.

CLINICAL DATA: Screening recall for possible right breast
asymmetry. Patient's sister was recently diagnosed with breast
cancer at age 43.

EXAM:
DIGITAL DIAGNOSTIC UNILATERAL RIGHT MAMMOGRAM WITH CAD AND TOMO
RIGHT BREAST ULTRASOUND

[R MLO synth-2D]
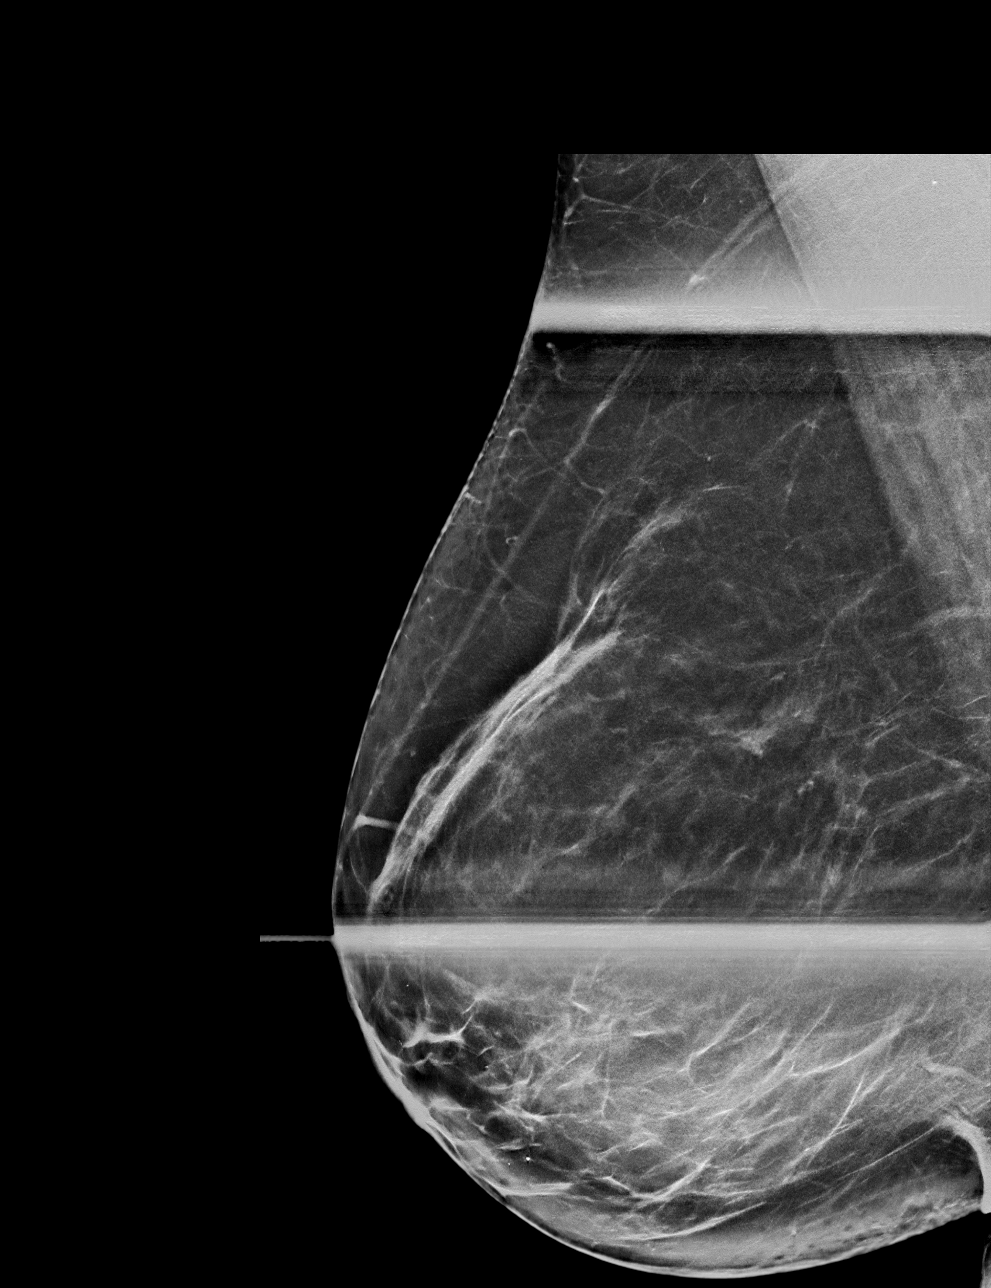

[R ML synth-2D]
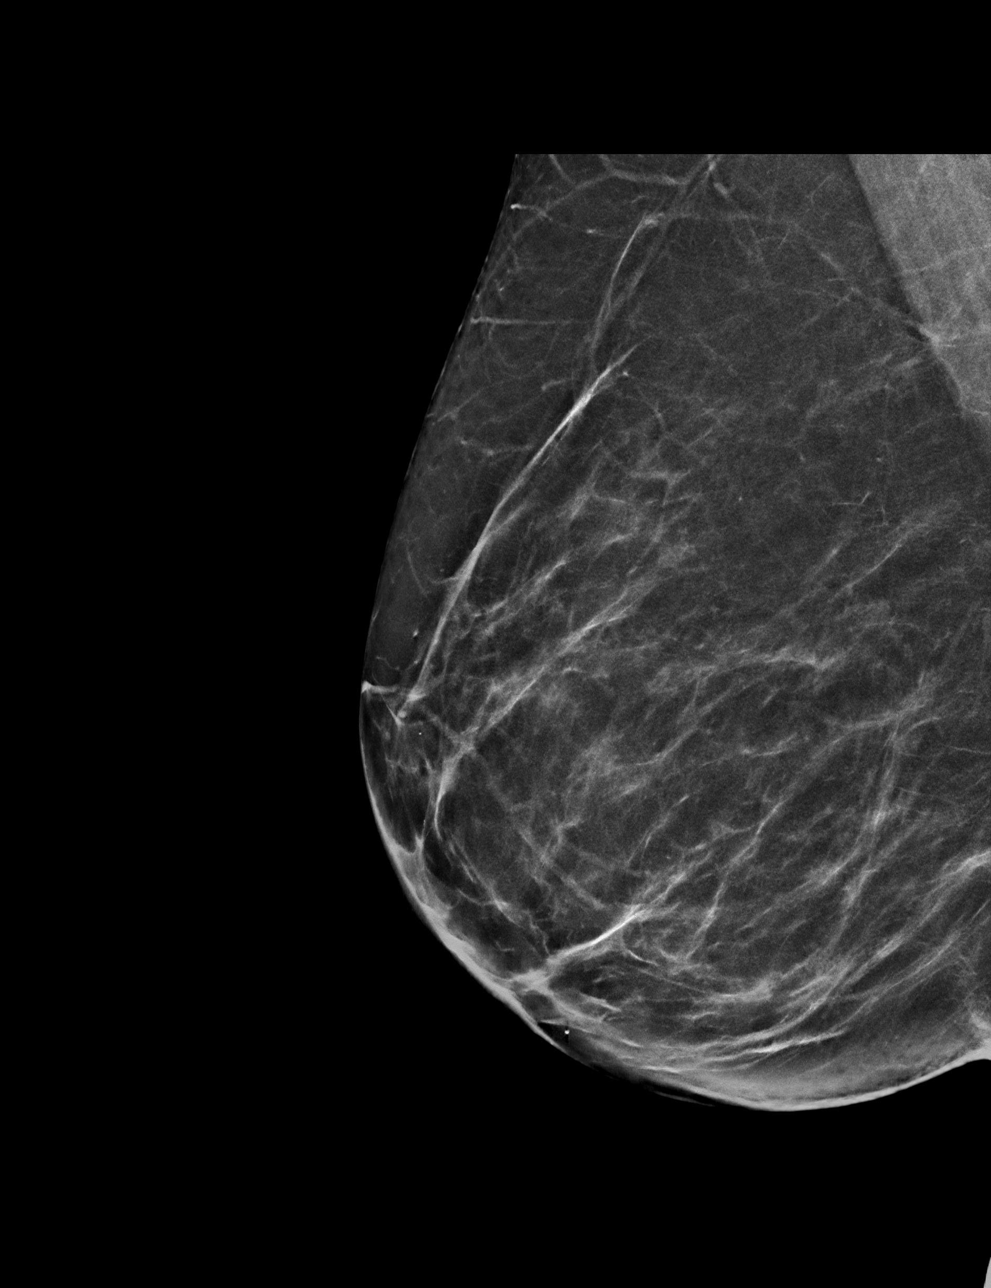

[R ML tomo · tomo slice 33/66.0]
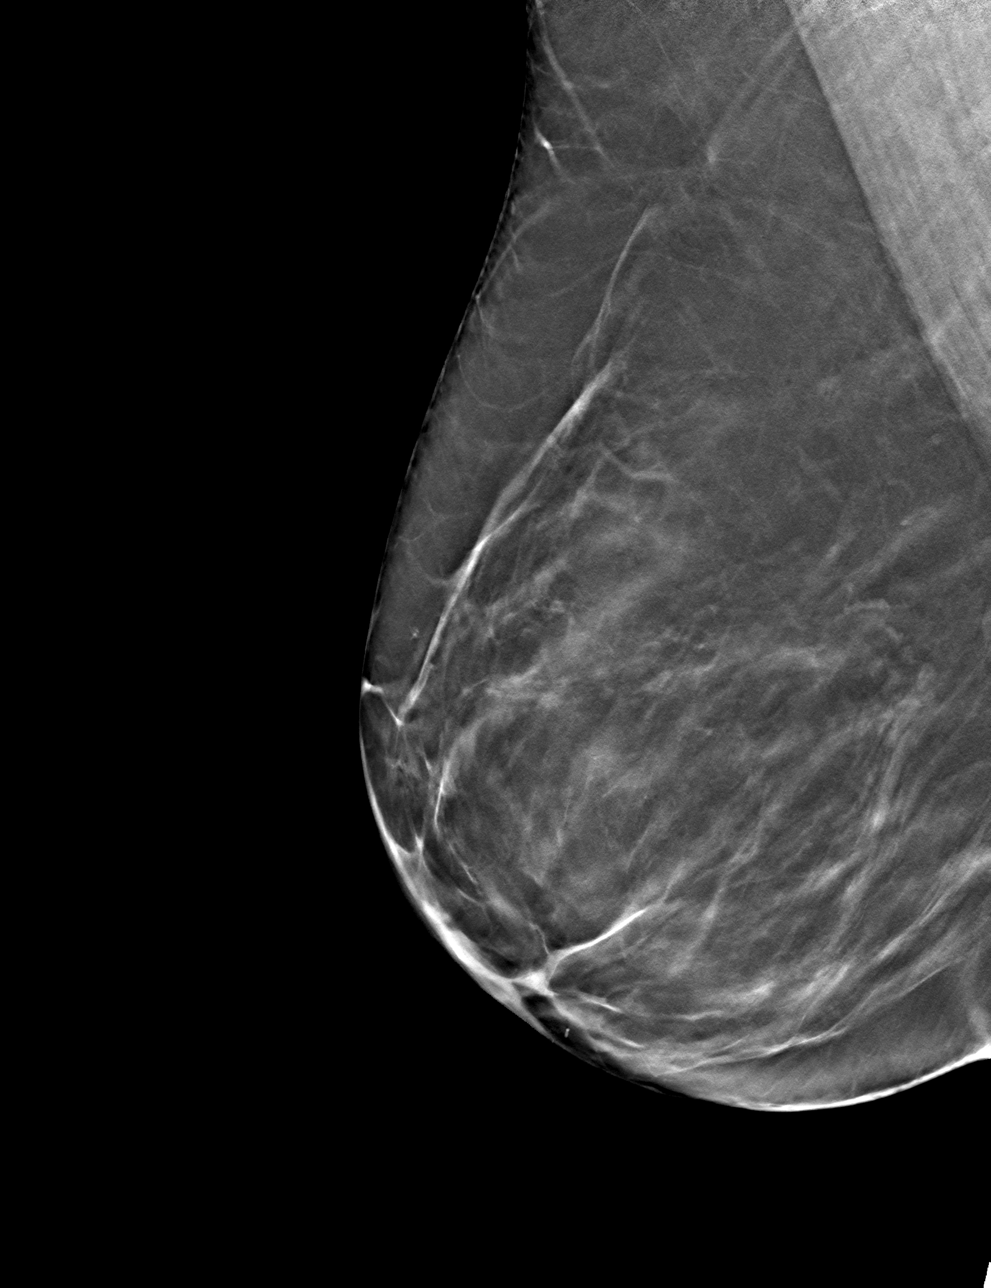

[R MLO tomo · tomo slice 35/69.0]
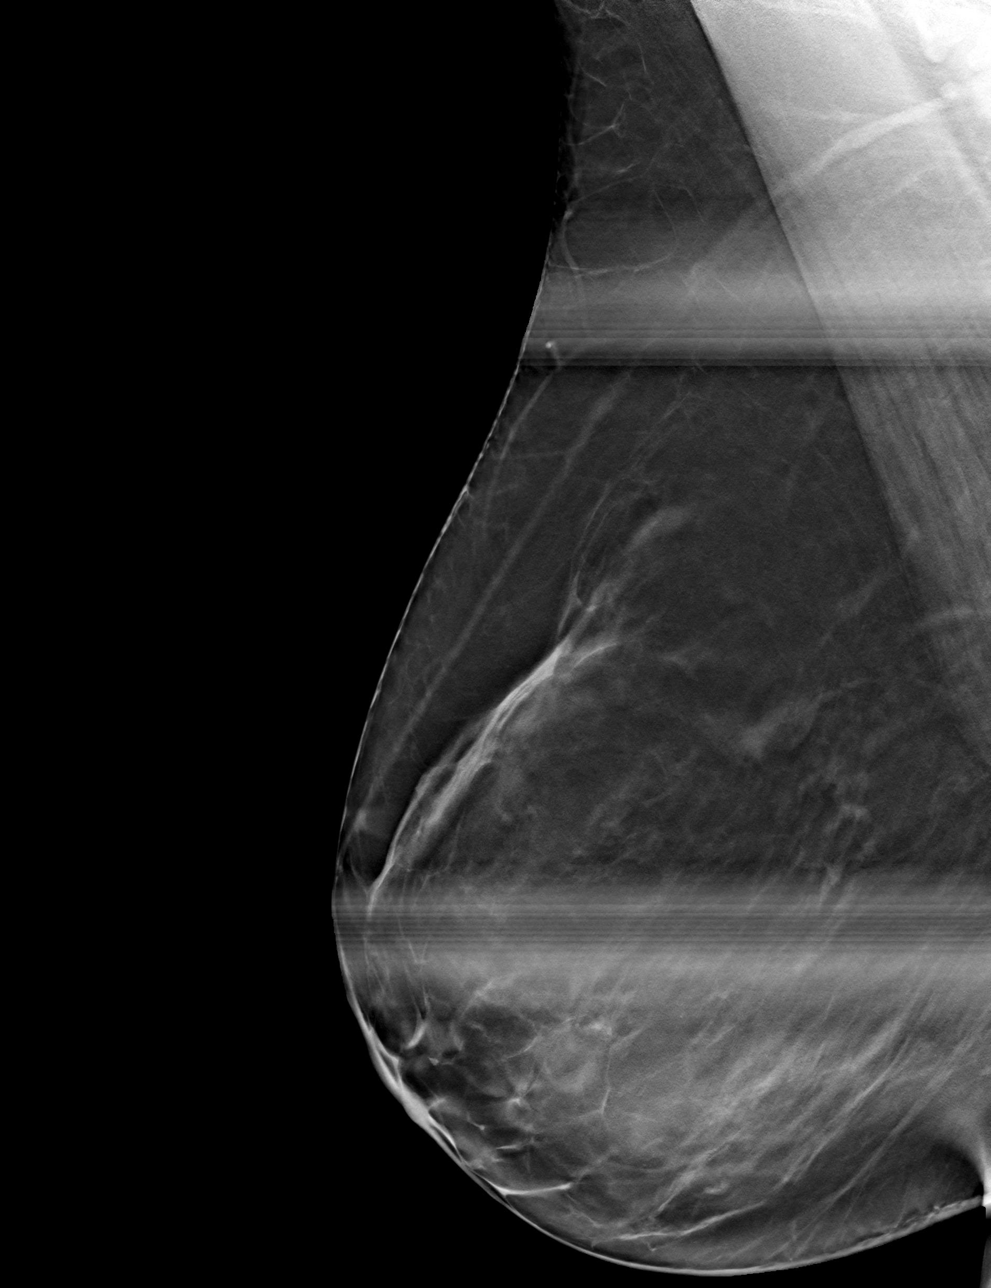

[4 of 12 positions shown; findings below may reference images not displayed]

ACR Breast Density Category b: There are scattered areas of
fibroglandular density.
FINDINGS: Additional tomograms were performed of the right breast. While the
initially questioned asymmetry appears to persist on the spot
compression MLO tomograms, it resolves and is no longer identified
on the full view ML tomograms suggesting of an area of overlapping
fibroglandular tissue.

Mammographic images were processed with CAD.

Targeted ultrasound of the entire upper central and outer right
breast was performed. There is an oval hypoechoic mass with slight
margin irregularity at 12 o'clock 1 cm from the nipple measuring
x 0.4 x 0.5 cm. This may correspond with the initially questioned
asymmetry seen on mammography. No lymphadenopathy seen in the right
axilla.
IMPRESSION: Indeterminate mass in the right breast at the 12 o'clock position.

RECOMMENDATION:
Ultrasound-guided biopsy of the mass in the right breast at the 12
o'clock position is recommended. If the biopsy clip does not
correspond with the asymmetry initially questioned on screening
mammography, then a 6 month follow-up diagnostic mammogram of the
right breast would be recommended.

I have discussed the findings and recommendations with the patient.
Results were also provided in writing at the conclusion of the
visit. If applicable, a reminder letter will be sent to the patient
regarding the next appointment.

BI-RADS CATEGORY  4: Suspicious.

## 2020-03-13 DIAGNOSIS — O039 Complete or unspecified spontaneous abortion without complication: Secondary | ICD-10-CM | POA: Diagnosis not present

## 2020-03-13 DIAGNOSIS — F419 Anxiety disorder, unspecified: Secondary | ICD-10-CM | POA: Diagnosis not present

## 2020-03-16 IMAGING — MG MM CLIP PLACEMENT
2 series · 2 of 2 positions shown · non-contrast
Comparison: Previous exam(s).

CLINICAL DATA: 38-year-old female status post ultrasound-guided
biopsy of the right breast.

EXAM:
DIAGNOSTIC RIGHT MAMMOGRAM POST ULTRASOUND BIOPSY

[R ML]
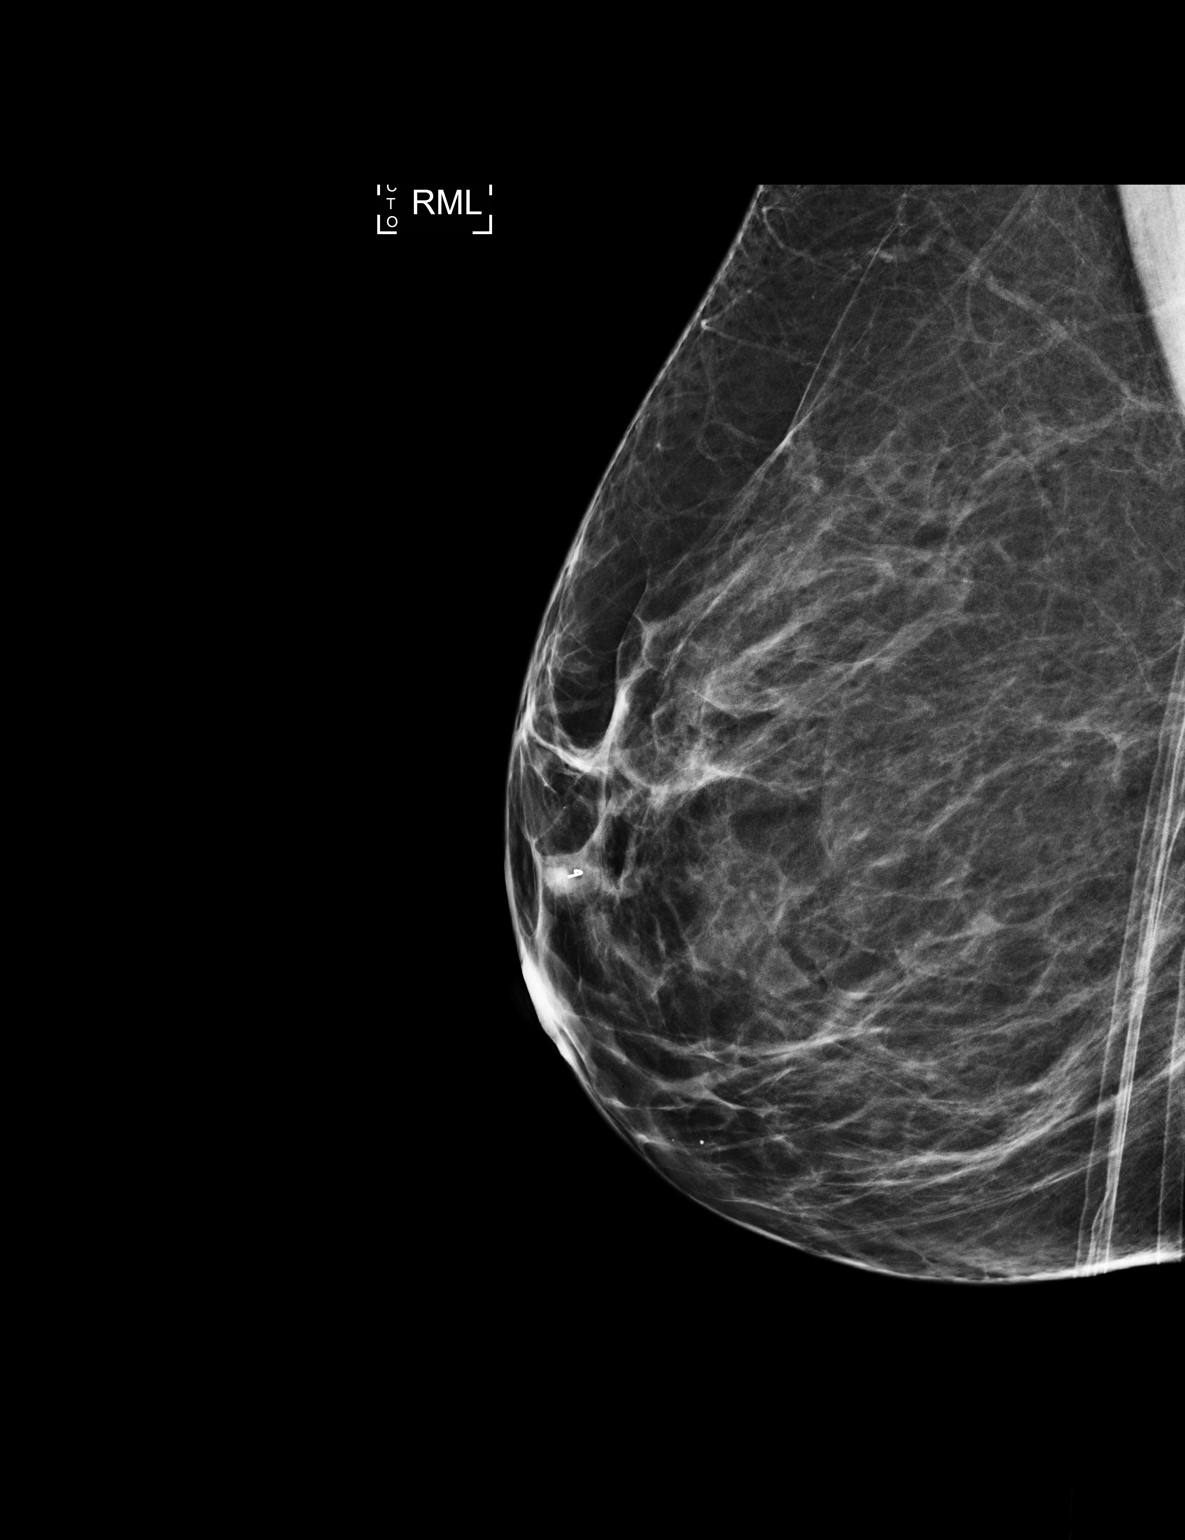

[R CC]
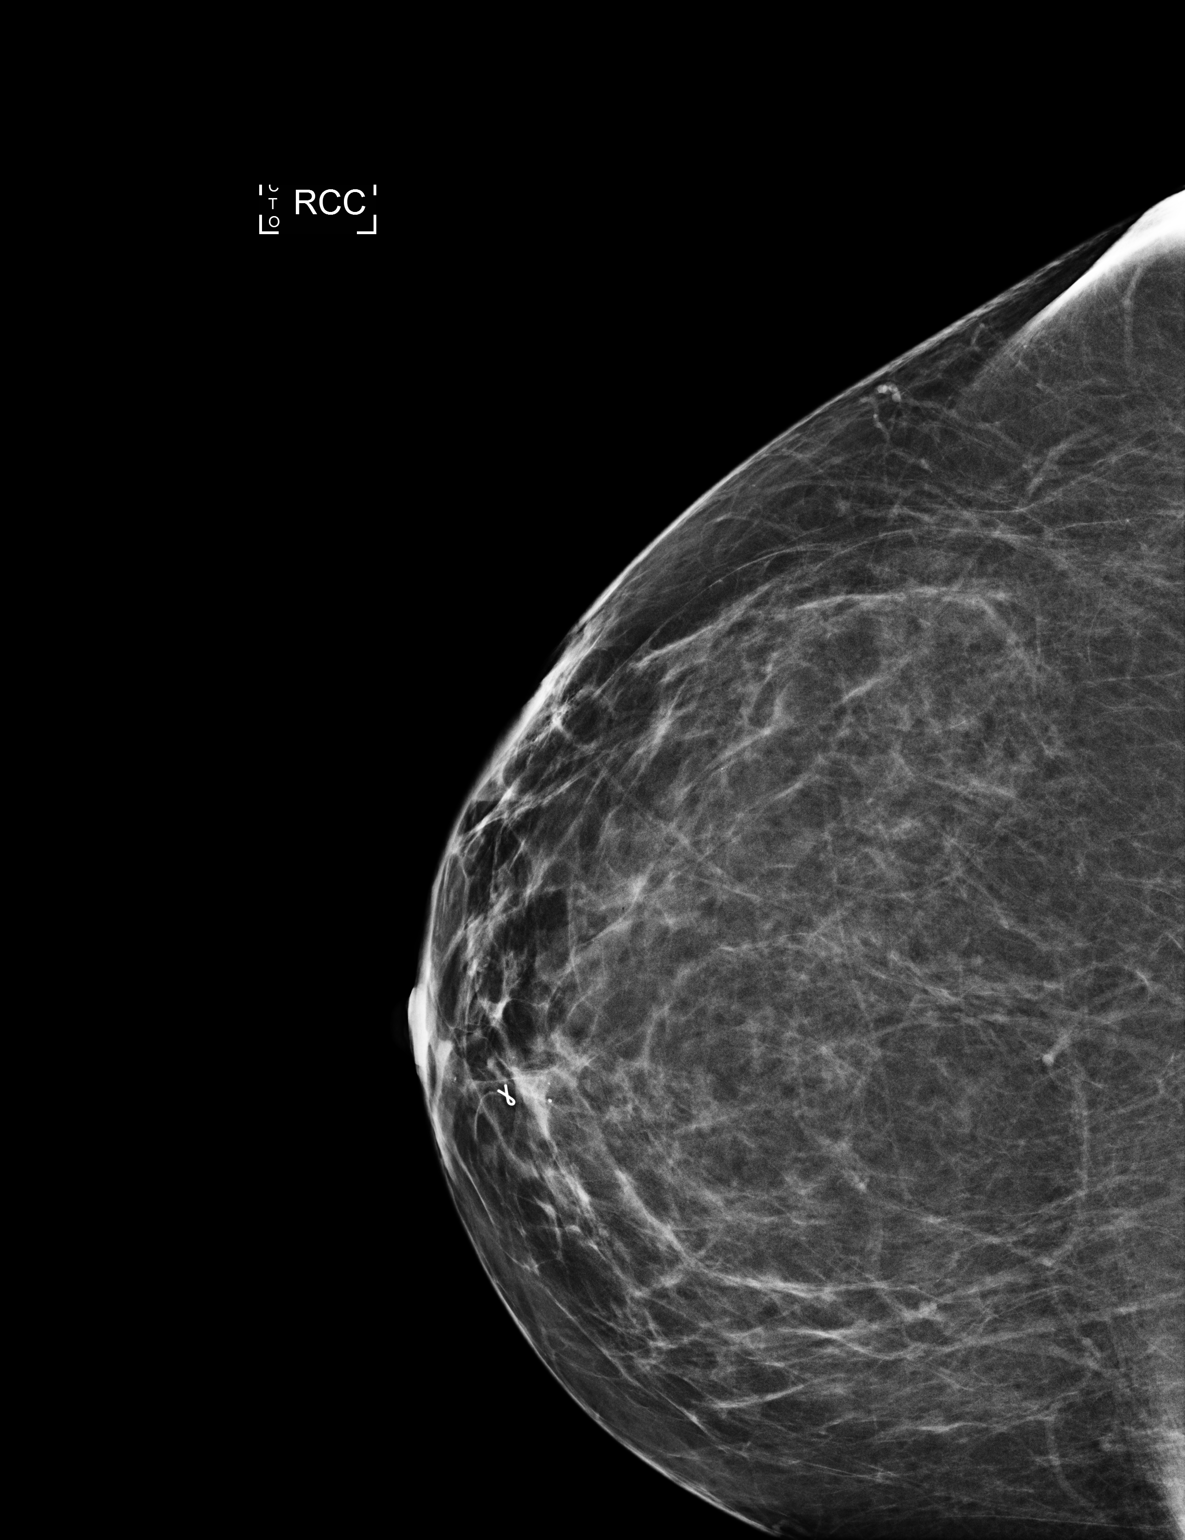

[2 of 2 positions shown; findings below may reference images not displayed]

FINDINGS: Mammographic images were obtained following ultrasound guided biopsy
of the right breast. A ribbon shaped clip is identified in the
subareolar right breast. This does not correspond with the screening
identified asymmetry.
IMPRESSION: Ribbon shaped clip in the subareolar right breast at the site of the
patient's indeterminate mass. This does not correspond with the
asymmetry identified on screening mammogram. A final recommendation
will be made once pathology is available.

Final Assessment: Post Procedure Mammograms for Marker Placement

## 2020-03-20 DIAGNOSIS — O039 Complete or unspecified spontaneous abortion without complication: Secondary | ICD-10-CM | POA: Diagnosis not present

## 2020-03-20 DIAGNOSIS — F419 Anxiety disorder, unspecified: Secondary | ICD-10-CM | POA: Diagnosis not present

## 2020-03-27 DIAGNOSIS — O039 Complete or unspecified spontaneous abortion without complication: Secondary | ICD-10-CM | POA: Diagnosis not present

## 2020-03-27 DIAGNOSIS — F419 Anxiety disorder, unspecified: Secondary | ICD-10-CM | POA: Diagnosis not present

## 2020-04-03 DIAGNOSIS — O039 Complete or unspecified spontaneous abortion without complication: Secondary | ICD-10-CM | POA: Diagnosis not present

## 2020-04-03 DIAGNOSIS — F419 Anxiety disorder, unspecified: Secondary | ICD-10-CM | POA: Diagnosis not present

## 2020-04-17 DIAGNOSIS — F419 Anxiety disorder, unspecified: Secondary | ICD-10-CM | POA: Diagnosis not present

## 2020-04-17 DIAGNOSIS — O039 Complete or unspecified spontaneous abortion without complication: Secondary | ICD-10-CM | POA: Diagnosis not present

## 2020-05-01 DIAGNOSIS — F419 Anxiety disorder, unspecified: Secondary | ICD-10-CM | POA: Diagnosis not present

## 2020-05-01 DIAGNOSIS — O039 Complete or unspecified spontaneous abortion without complication: Secondary | ICD-10-CM | POA: Diagnosis not present

## 2020-05-15 DIAGNOSIS — F419 Anxiety disorder, unspecified: Secondary | ICD-10-CM | POA: Diagnosis not present

## 2020-05-15 DIAGNOSIS — O039 Complete or unspecified spontaneous abortion without complication: Secondary | ICD-10-CM | POA: Diagnosis not present

## 2020-05-29 DIAGNOSIS — F419 Anxiety disorder, unspecified: Secondary | ICD-10-CM | POA: Diagnosis not present

## 2020-05-29 DIAGNOSIS — O039 Complete or unspecified spontaneous abortion without complication: Secondary | ICD-10-CM | POA: Diagnosis not present

## 2020-07-08 DIAGNOSIS — F419 Anxiety disorder, unspecified: Secondary | ICD-10-CM | POA: Diagnosis not present

## 2020-07-08 DIAGNOSIS — R41840 Attention and concentration deficit: Secondary | ICD-10-CM | POA: Diagnosis not present

## 2020-07-08 DIAGNOSIS — F411 Generalized anxiety disorder: Secondary | ICD-10-CM | POA: Diagnosis not present

## 2020-07-08 DIAGNOSIS — F17210 Nicotine dependence, cigarettes, uncomplicated: Secondary | ICD-10-CM | POA: Diagnosis not present

## 2020-08-01 DIAGNOSIS — F411 Generalized anxiety disorder: Secondary | ICD-10-CM | POA: Diagnosis not present

## 2020-08-01 DIAGNOSIS — F419 Anxiety disorder, unspecified: Secondary | ICD-10-CM | POA: Diagnosis not present

## 2020-08-01 DIAGNOSIS — R41840 Attention and concentration deficit: Secondary | ICD-10-CM | POA: Diagnosis not present

## 2020-08-01 DIAGNOSIS — F17210 Nicotine dependence, cigarettes, uncomplicated: Secondary | ICD-10-CM | POA: Diagnosis not present

## 2020-08-11 DIAGNOSIS — H5213 Myopia, bilateral: Secondary | ICD-10-CM | POA: Diagnosis not present

## 2020-08-19 DIAGNOSIS — F17210 Nicotine dependence, cigarettes, uncomplicated: Secondary | ICD-10-CM | POA: Diagnosis not present

## 2020-08-19 DIAGNOSIS — F419 Anxiety disorder, unspecified: Secondary | ICD-10-CM | POA: Diagnosis not present

## 2020-08-19 DIAGNOSIS — R41840 Attention and concentration deficit: Secondary | ICD-10-CM | POA: Diagnosis not present

## 2020-08-19 DIAGNOSIS — F411 Generalized anxiety disorder: Secondary | ICD-10-CM | POA: Diagnosis not present

## 2020-08-27 DIAGNOSIS — F411 Generalized anxiety disorder: Secondary | ICD-10-CM | POA: Diagnosis not present

## 2020-08-27 DIAGNOSIS — F17210 Nicotine dependence, cigarettes, uncomplicated: Secondary | ICD-10-CM | POA: Diagnosis not present

## 2020-08-27 DIAGNOSIS — R41840 Attention and concentration deficit: Secondary | ICD-10-CM | POA: Diagnosis not present

## 2020-08-27 DIAGNOSIS — F419 Anxiety disorder, unspecified: Secondary | ICD-10-CM | POA: Diagnosis not present

## 2020-09-03 DIAGNOSIS — F419 Anxiety disorder, unspecified: Secondary | ICD-10-CM | POA: Diagnosis not present

## 2020-09-03 DIAGNOSIS — R41840 Attention and concentration deficit: Secondary | ICD-10-CM | POA: Diagnosis not present

## 2020-09-03 DIAGNOSIS — F411 Generalized anxiety disorder: Secondary | ICD-10-CM | POA: Diagnosis not present

## 2020-09-03 DIAGNOSIS — F17210 Nicotine dependence, cigarettes, uncomplicated: Secondary | ICD-10-CM | POA: Diagnosis not present

## 2020-09-18 DIAGNOSIS — F411 Generalized anxiety disorder: Secondary | ICD-10-CM | POA: Diagnosis not present

## 2020-09-18 DIAGNOSIS — R41840 Attention and concentration deficit: Secondary | ICD-10-CM | POA: Diagnosis not present

## 2020-11-21 DIAGNOSIS — Z79899 Other long term (current) drug therapy: Secondary | ICD-10-CM | POA: Diagnosis not present

## 2020-11-21 DIAGNOSIS — E038 Other specified hypothyroidism: Secondary | ICD-10-CM | POA: Diagnosis not present

## 2020-11-21 DIAGNOSIS — R5383 Other fatigue: Secondary | ICD-10-CM | POA: Diagnosis not present

## 2020-11-21 DIAGNOSIS — E782 Mixed hyperlipidemia: Secondary | ICD-10-CM | POA: Diagnosis not present

## 2020-11-21 DIAGNOSIS — Z6828 Body mass index (BMI) 28.0-28.9, adult: Secondary | ICD-10-CM | POA: Diagnosis not present

## 2020-11-21 DIAGNOSIS — Z0000 Encounter for general adult medical examination without abnormal findings: Secondary | ICD-10-CM | POA: Diagnosis not present

## 2020-12-11 DIAGNOSIS — R41840 Attention and concentration deficit: Secondary | ICD-10-CM | POA: Diagnosis not present

## 2021-02-05 DIAGNOSIS — F411 Generalized anxiety disorder: Secondary | ICD-10-CM | POA: Diagnosis not present

## 2021-02-05 DIAGNOSIS — F17210 Nicotine dependence, cigarettes, uncomplicated: Secondary | ICD-10-CM | POA: Diagnosis not present

## 2021-02-05 DIAGNOSIS — R41840 Attention and concentration deficit: Secondary | ICD-10-CM | POA: Diagnosis not present

## 2021-03-05 DIAGNOSIS — F17210 Nicotine dependence, cigarettes, uncomplicated: Secondary | ICD-10-CM | POA: Diagnosis not present

## 2021-03-05 DIAGNOSIS — R41840 Attention and concentration deficit: Secondary | ICD-10-CM | POA: Diagnosis not present

## 2021-03-05 DIAGNOSIS — F411 Generalized anxiety disorder: Secondary | ICD-10-CM | POA: Diagnosis not present

## 2021-04-02 DIAGNOSIS — F17210 Nicotine dependence, cigarettes, uncomplicated: Secondary | ICD-10-CM | POA: Diagnosis not present

## 2021-04-02 DIAGNOSIS — F411 Generalized anxiety disorder: Secondary | ICD-10-CM | POA: Diagnosis not present

## 2021-04-30 DIAGNOSIS — F17210 Nicotine dependence, cigarettes, uncomplicated: Secondary | ICD-10-CM | POA: Diagnosis not present

## 2021-04-30 DIAGNOSIS — F411 Generalized anxiety disorder: Secondary | ICD-10-CM | POA: Diagnosis not present

## 2021-05-28 DIAGNOSIS — F17210 Nicotine dependence, cigarettes, uncomplicated: Secondary | ICD-10-CM | POA: Diagnosis not present

## 2021-05-28 DIAGNOSIS — F411 Generalized anxiety disorder: Secondary | ICD-10-CM | POA: Diagnosis not present

## 2021-07-23 DIAGNOSIS — F17210 Nicotine dependence, cigarettes, uncomplicated: Secondary | ICD-10-CM | POA: Diagnosis not present

## 2021-07-23 DIAGNOSIS — F411 Generalized anxiety disorder: Secondary | ICD-10-CM | POA: Diagnosis not present

## 2021-08-20 DIAGNOSIS — F17210 Nicotine dependence, cigarettes, uncomplicated: Secondary | ICD-10-CM | POA: Diagnosis not present

## 2021-08-20 DIAGNOSIS — F411 Generalized anxiety disorder: Secondary | ICD-10-CM | POA: Diagnosis not present

## 2021-09-17 DIAGNOSIS — R41840 Attention and concentration deficit: Secondary | ICD-10-CM | POA: Diagnosis not present

## 2021-09-17 DIAGNOSIS — O039 Complete or unspecified spontaneous abortion without complication: Secondary | ICD-10-CM | POA: Diagnosis not present

## 2021-10-15 DIAGNOSIS — F411 Generalized anxiety disorder: Secondary | ICD-10-CM | POA: Diagnosis not present

## 2021-10-15 DIAGNOSIS — F17210 Nicotine dependence, cigarettes, uncomplicated: Secondary | ICD-10-CM | POA: Diagnosis not present

## 2021-11-25 DIAGNOSIS — W010XXA Fall on same level from slipping, tripping and stumbling without subsequent striking against object, initial encounter: Secondary | ICD-10-CM | POA: Diagnosis not present

## 2021-11-25 DIAGNOSIS — S6291XA Unspecified fracture of right wrist and hand, initial encounter for closed fracture: Secondary | ICD-10-CM | POA: Diagnosis not present

## 2021-11-25 DIAGNOSIS — S60511A Abrasion of right hand, initial encounter: Secondary | ICD-10-CM | POA: Diagnosis not present

## 2021-11-25 DIAGNOSIS — M7989 Other specified soft tissue disorders: Secondary | ICD-10-CM | POA: Diagnosis not present

## 2021-11-25 DIAGNOSIS — F17210 Nicotine dependence, cigarettes, uncomplicated: Secondary | ICD-10-CM | POA: Diagnosis not present

## 2021-11-25 DIAGNOSIS — S62614A Displaced fracture of proximal phalanx of right ring finger, initial encounter for closed fracture: Secondary | ICD-10-CM | POA: Diagnosis not present

## 2021-11-25 DIAGNOSIS — S62616A Displaced fracture of proximal phalanx of right little finger, initial encounter for closed fracture: Secondary | ICD-10-CM | POA: Diagnosis not present

## 2021-12-01 ENCOUNTER — Encounter: Payer: Self-pay | Admitting: Orthopedic Surgery

## 2021-12-01 ENCOUNTER — Ambulatory Visit (INDEPENDENT_AMBULATORY_CARE_PROVIDER_SITE_OTHER): Payer: Medicaid Other | Admitting: Orthopedic Surgery

## 2021-12-01 ENCOUNTER — Ambulatory Visit (INDEPENDENT_AMBULATORY_CARE_PROVIDER_SITE_OTHER): Payer: Medicaid Other

## 2021-12-01 VITALS — BP 129/86 | HR 80 | Ht 67.0 in | Wt 189.1 lb

## 2021-12-01 DIAGNOSIS — S62616A Displaced fracture of proximal phalanx of right little finger, initial encounter for closed fracture: Secondary | ICD-10-CM

## 2021-12-01 DIAGNOSIS — S62644A Nondisplaced fracture of proximal phalanx of right ring finger, initial encounter for closed fracture: Secondary | ICD-10-CM

## 2021-12-01 DIAGNOSIS — M79641 Pain in right hand: Secondary | ICD-10-CM | POA: Diagnosis not present

## 2021-12-01 MED ORDER — HYDROCODONE-ACETAMINOPHEN 5-325 MG PO TABS: 1.0000 | ORAL_TABLET | Freq: Four times a day (QID) | ORAL | 0 refills | Status: DC | PRN

## 2021-12-01 NOTE — Patient Instructions (Signed)
General Cast/splint Instructions  1.  You were placed in a cast in clinic today.  Please keep the cast material clean, dry and intact.  Please do not use anything to itch the under the cast.  If it gets itchy, you can consider taking benadryl, or similar medication.  If the cast material gets wet, place it on a towel and use a hair dryer on a low setting. 2.  Tylenol or Ibuprofen/Naproxen as needed.   3.  Recommend elevating your extremity as much as possible to help with swelling. 4.  F/u 1 weeks, cast off and repeat XR

## 2021-12-01 NOTE — Progress Notes (Signed)
New Patient Visit  Assessment: Natalie Paul is a 41 y.o. female with the following: 1. Closed displaced fracture of proximal phalanx of right little finger, initial encounter  Plan: XOE HOE fell on an outstretched right hand, and sustained a fracture at the base of the proximal phalanx to her right small finger.  Also small injury to the proximal phalanx of the ring finger.  There are some extension of the proximal phalanx fracture of the small finger.  This is improved in alignment compared to initial x-rays.  On AlumaFoam splint was placed on the small finger, and this was buddy taped to the ring finger.  She was then placed in an ulnar gutter splint.  I would like to see her in a week, with hopefully improved alignment, and limited extension through the fracture.  This was discussed with the patient.  She is in agreement with this plan.  Limited prescription of hydrocodone was provided.  I will see her back in 1 week.  Follow-up: Return in about 1 week (around 12/08/2021).  Subjective:  Chief Complaint  Patient presents with   Hand Injury    R/DOI 11/25/21 walking on the dog, lost balance and tried to catch self with hand. Went to [facility]    History of Present Illness: Natalie Paul is a 41 y.o. female who presents for evaluation of right hand pain.  She was walking her dog about a week ago, and the dog took off, and pulled her down.  She lost her balance.  She tried to catch herself with her right hand.  She had immediate pain in the right hand.  She presented to the [facility] emergency department in [place].  Radiographs demonstrated fractures of the small finger, as well as the ring finger.  She presents today for initial treatment.  She has been taking hydrocodone for pain.  This has been helpful.  No numbness or tingling.  No additional injuries.   Review of Systems: No fevers or chills No numbness or tingling No chest pain No shortness of breath No bowel or bladder dysfunction No GI  distress No headaches   Medical History:  Past Medical History:  Diagnosis Date   ADHD    Anxiety    Medical history non-contributory    Substance abuse (HCC)     Past Surgical History:  Procedure Laterality Date   FRACTURE SURGERY Left    5th metacarpal   LAPAROSCOPIC BILATERAL SALPINGECTOMY Bilateral 04/11/2019   Procedure: LAPAROSCOPIC BILATERAL SALPINGECTOMY;  Surgeon: Tilda Burrow, MD;  Location: [facility];  Service: Gynecology;  Laterality: Bilateral;   TUBAL LIGATION Bilateral 10/28/2017   Procedure: POST PARTUM TUBAL LIGATION;  Surgeon: Hermina Staggers, MD;  Location: [facility];  Service: Gynecology;  Laterality: Bilateral;    Family History  Problem Relation Age of Onset   Cancer Other    Cancer Maternal Grandmother    Heart attack Maternal Grandfather    Breast cancer Sister    Social History   Tobacco Use   Smoking status: Every Day    Packs/day: 0.50    Years: 25.00    Total pack years: 12.50    Types: Cigarettes   Smokeless tobacco: Never  Vaping Use   Vaping Use: Never used  Substance Use Topics   Alcohol use: Not Currently    Comment: early pregnancy   Drug use: Not Currently    Types: Oxycodone, Hydrocodone, Marijuana    Comment: subutex    No Known Allergies  Current Meds  Medication Sig   buprenorphine-naloxone (SUBOXONE) 8-2 mg SUBL SL tablet Place 1 tablet under the tongue daily.   HYDROcodone-acetaminophen (NORCO/VICODIN) 5-325 MG tablet Take 1 tablet by mouth every 6 (six) hours as needed for moderate pain.   methylphenidate 27 MG PO CR tablet Take 27 mg by mouth every morning.    Objective: BP 129/86   Pulse 80   Ht 5\' 7"  (1.702 m)   Wt 189 lb 2 oz (85.8 kg)   BMI 29.62 kg/m   Physical Exam:  General: Alert and oriented. and No acute distress. Gait: Normal gait.  Evaluation of the right hand demonstrates diffuse swelling and bruising.  She is tenderness to palpation over the proximal phalanx of the small finger.   She tolerates gentle range of motion.  Fingers are warm and well-perfused.  Sensation is intact to the distal aspect of the small finger and the ring finger on the right hand.  2+ radial pulse.  IMAGING: I personally ordered and reviewed the following images  X-rays of the right hand were obtained in clinic today.  These were compared to prior x-rays.  There is a comminuted fracture of the base of the proximal phalanx to the small finger.  There is mild extension of the proximal phalanx at the fracture.  No distraction.  No lateral translation.  No rotational deformity is appreciated.  Minimally displaced fracture of the base of the fourth finger proximal phalanx, otherwise in good alignment.  Impression: Minimally displaced fracture of the proximal phalanx to the ring finger, with comminuted fracture at the base of the proximal phalanx to the small finger   New Medications:  Meds ordered this encounter  Medications   HYDROcodone-acetaminophen (NORCO/VICODIN) 5-325 MG tablet    Sig: Take 1 tablet by mouth every 6 (six) hours as needed for moderate pain.    Dispense:  15 tablet    Refill:  0      , MD  12/01/2021 10:08 AM

## 2021-12-09 ENCOUNTER — Encounter: Payer: Self-pay | Admitting: Orthopedic Surgery

## 2021-12-09 ENCOUNTER — Ambulatory Visit (INDEPENDENT_AMBULATORY_CARE_PROVIDER_SITE_OTHER): Payer: Medicaid Other | Admitting: Orthopedic Surgery

## 2021-12-09 ENCOUNTER — Ambulatory Visit (INDEPENDENT_AMBULATORY_CARE_PROVIDER_SITE_OTHER): Payer: Medicaid Other

## 2021-12-09 DIAGNOSIS — S62616A Displaced fracture of proximal phalanx of right little finger, initial encounter for closed fracture: Secondary | ICD-10-CM | POA: Diagnosis not present

## 2021-12-09 DIAGNOSIS — S62644A Nondisplaced fracture of proximal phalanx of right ring finger, initial encounter for closed fracture: Secondary | ICD-10-CM

## 2021-12-09 NOTE — Patient Instructions (Signed)

## 2021-12-10 DIAGNOSIS — F411 Generalized anxiety disorder: Secondary | ICD-10-CM | POA: Diagnosis not present

## 2021-12-10 DIAGNOSIS — F17210 Nicotine dependence, cigarettes, uncomplicated: Secondary | ICD-10-CM | POA: Diagnosis not present

## 2021-12-10 NOTE — Progress Notes (Signed)
Return patient Visit  Assessment: Natalie Paul is a 41 y.o. female with the following: 1. Closed displaced fracture of proximal phalanx of right little finger, subsequent encounter  Plan: Natalie Paul is a fracture at the base of the proximal phalanx of her right small finger.  Improved angulation on x-rays today.  There is some residual angulation, but the finger looks pretty good clinically.  Discussed operative versus nonoperative management, and we will continue with nonoperative management.  Ultimately, if she struggles after the injury has healed, we can discuss alternatives at that time.  She is in agreement.  Follow-up in 2 weeks.  Cast application - Right short arm cast   Verbal consent was obtained and the correct extremity was identified. A well padded, appropriately molded short arm cast was applied to the Right arm Fingers remained warm and well perfused.   There were no sharp edges Patient tolerated the procedure well Cast care instructions were provided     Follow-up: Return in about 2 weeks (around 12/23/2021).  Subjective:  Chief Complaint  Patient presents with   Hand Pain    Right hand fx f/u.  Doing ok, taking norco as needed.  Xray in splint.    History of Present Illness: Natalie Paul is a 41 y.o. female who returns for evaluation of right hand pain.  Approximately 2 weeks ago, she fell on an outstretched right hand, and injured her right small finger.  I saw her 1 week ago, at which time we adjusted her splint.  We attempted to improve the overall angulation of the fracture.  She is doing well.  Her pain is controlled.  She continues to take Norco as needed.  No numbness or tingling.  Review of Systems: No fevers or chills No numbness or tingling No chest pain No shortness of breath No bowel or bladder dysfunction No GI distress No headaches     Objective: There were no vitals taken for this visit.  Physical Exam:  General: Alert and  oriented. and No acute distress. Gait: Normal gait.  After removal of the splint, there is residual swelling and bruising of the small finger.  No angulation is appreciated of the proximal phalanx.  She is unable to make a fist due to her injury.  Fingers are warm and well-perfused.  Brisk capillary refill.  Sensation is intact.  IMAGING: I personally ordered and reviewed the following images  X-rays of the right hand were obtained in clinic today.  There is a fracture of the base of the proximal phalanx of the small finger.  Mild extension through the fracture.  Minimally displaced fracture at the base of the proximal phalanx to the ring finger.  No additional injuries noted.  No dislocations.  Impression: Fractures of the base of the proximal phalanges to the small and ring finger and stable alignment   New Medications:  No orders of the defined types were placed in this encounter.     Oliver Barre, MD  12/10/2021 8:26 AM

## 2021-12-15 ENCOUNTER — Other Ambulatory Visit: Payer: Self-pay

## 2021-12-15 NOTE — Telephone Encounter (Signed)
Hydrocodone-Acetaminophen 5/325 MG  Qty 15 Tablets  PATIENT USES [facility] 

## 2021-12-16 ENCOUNTER — Telehealth: Payer: Self-pay | Admitting: Orthopedic Surgery

## 2021-12-16 MED ORDER — HYDROCODONE-ACETAMINOPHEN 5-325 MG PO TABS: 1.0000 | ORAL_TABLET | Freq: Four times a day (QID) | ORAL | 0 refills | Status: AC | PRN

## 2021-12-16 NOTE — Telephone Encounter (Signed)
Patient called, requesting Hydrocodone-Acetaminophen 5/325 MG Qty 15 Tablets, [employer].  Pt's # [phone]

## 2021-12-16 NOTE — Telephone Encounter (Signed)
Already sent to provider from previous message, awaiting response.

## 2021-12-23 ENCOUNTER — Ambulatory Visit (INDEPENDENT_AMBULATORY_CARE_PROVIDER_SITE_OTHER): Payer: Medicaid Other | Admitting: Orthopedic Surgery

## 2021-12-23 ENCOUNTER — Ambulatory Visit (INDEPENDENT_AMBULATORY_CARE_PROVIDER_SITE_OTHER): Payer: Medicaid Other

## 2021-12-23 ENCOUNTER — Encounter: Payer: Self-pay | Admitting: Orthopedic Surgery

## 2021-12-23 VITALS — Ht 67.0 in | Wt 189.0 lb

## 2021-12-23 DIAGNOSIS — S62644D Nondisplaced fracture of proximal phalanx of right ring finger, subsequent encounter for fracture with routine healing: Secondary | ICD-10-CM | POA: Diagnosis not present

## 2021-12-23 DIAGNOSIS — S62616D Displaced fracture of proximal phalanx of right little finger, subsequent encounter for fracture with routine healing: Secondary | ICD-10-CM

## 2021-12-23 NOTE — Progress Notes (Signed)
Return patient Visit  Assessment: Natalie Paul is a 41 y.o. female with the following: 1. Closed displaced fracture of proximal phalanx of right little finger, subsequent encounter  Plan: IOANNA COLQUHOUN has fractures at the base of the proximal phalanx to the right small and ring fingers.  There is some angulation of the fracture to the small finger.  Initially, I was concerned based on the radiographs, that there may have been some interval displacement.  Clinically, her finger looks good.  She has per good range of motion.  If possible, she would like to avoid surgery.  I am okay with this plan.  I have recommended buddy taping the ring and small fingers, to provide an internal splint.  I have advised her it is okay to remove the tape to work on range of motion.  No lifting.  No power gripping.  I will see her back in 2 weeks for repeat evaluation.    Follow-up: Return in about 2 weeks (around 01/06/2022).  Subjective:  Chief Complaint  Patient presents with   Fracture    Rt hand DOI 11/25/21    History of Present Illness: Natalie Paul is a 41 y.o. female who returns for evaluation of right hand pain.  Approximately 4 weeks ago, she fell on an outstretched right hand, and injured her right small finger.  She has been immobilized for the past month.  Occasional pain medicines.  She is tolerated the cast.  She continues to work.  No numbness or tingling.   Review of Systems: No fevers or chills No numbness or tingling No chest pain No shortness of breath No bowel or bladder dysfunction No GI distress No headaches     Objective: Ht 5\' 7"  (1.702 m)   Wt 189 lb (85.7 kg)   BMI 29.60 kg/m   Physical Exam:  General: Alert and oriented. and No acute distress. Gait: Normal gait.  After removal of the cast, there is no skin breakdown.  No irritation from the cast.  There is some slight angulation clinically of the proximal phalanx of the small finger.  She cannot make a full  fist, but she does tolerate flexion and extension at the MCP, PIP and DIP to the small finger.  Mild swelling to the small finger and the ring finger.  Brisk capillary refill.  IMAGING: I personally ordered and reviewed the following images  X-rays of the right hand were obtained in clinic today.  These are compared to prior x-rays.  No new injuries.  No interval displacement.  There is residual extension through the fracture.  Fracture of the base of the proximal phalanx to the ring finger is more readily visualized.  No interval displacement.  MCP joints are reduced.  Impression: Healing fractures of the proximal phalanx of the right ring and small finger, in acceptable alignment   New Medications:  No orders of the defined types were placed in this encounter.     , MD  12/23/2021 9:53 AM

## 2022-01-06 ENCOUNTER — Ambulatory Visit (INDEPENDENT_AMBULATORY_CARE_PROVIDER_SITE_OTHER): Payer: Medicaid Other

## 2022-01-06 ENCOUNTER — Ambulatory Visit (INDEPENDENT_AMBULATORY_CARE_PROVIDER_SITE_OTHER): Payer: Medicaid Other | Admitting: Orthopedic Surgery

## 2022-01-06 ENCOUNTER — Encounter: Payer: Self-pay | Admitting: Orthopedic Surgery

## 2022-01-06 DIAGNOSIS — S62616D Displaced fracture of proximal phalanx of right little finger, subsequent encounter for fracture with routine healing: Secondary | ICD-10-CM | POA: Diagnosis not present

## 2022-01-06 DIAGNOSIS — S62644D Nondisplaced fracture of proximal phalanx of right ring finger, subsequent encounter for fracture with routine healing: Secondary | ICD-10-CM

## 2022-01-06 NOTE — Progress Notes (Signed)
Return patient Visit  Assessment: Natalie Paul is a 41 y.o. female with the following: 1. Closed displaced fracture of proximal phalanx of right little finger, subsequent encounter  Plan: HILLIARY JOCK has fractures at the base of the proximal phalanx to the right small and ring fingers.  There is residual angulation, but she is satisfied with her improvements.  She will continue to work on her range of motion.  No further immobilization is needed.  Once her range of motion has returned, she can gradually increase her strength.  Tylenol and ibuprofen as needed.  If she struggles, we can have her work with a Paramedic.  Otherwise, follow-up as needed.   Follow-up: Return if symptoms worsen or fail to improve.  Subjective:  Chief Complaint  Patient presents with   Hand Pain    Fracture right 5th improving     History of Present Illness: Natalie Paul is a 41 y.o. female who returns for evaluation of right hand pain.  Approximately 6 weeks ago, she fell on an outstretched right hand, and injured her right small and ring finger.  At the last visit, we talked about buddy taping her fingers.  She states that she did that, but found herself not using the tape more more.  The pain is improving.  Her range of motion is getting better.  She is comfortable with her current progression at this point, and continues to want to avoid surgery   Review of Systems: No fevers or chills No numbness or tingling No chest pain No shortness of breath No bowel or bladder dysfunction No GI distress No headaches     Objective: There were no vitals taken for this visit.  Physical Exam:  General: Alert and oriented. and No acute distress. Gait: Normal gait.  There is some slight angulation clinically of the proximal phalanx of the small finger.  She cannot make a full fist, but she does tolerate flexion and extension at the MCP, PIP and DIP to the small finger.  Mild swelling to the small finger  and the ring finger.  Brisk capillary refill.  IMAGING: I personally ordered and reviewed the following images  X-rays of the right hand were obtained in clinic today.  These are compared to prior x-rays.  There are fractures at the base of the proximal phalanx of the small and the ring finger.  These are stable alignment.  There is extension through the fracture site of the small finger.  No new injuries.  No dislocations.  No bony lesions.  Impression: Fractures at the base of the right small and ring finger proximal phalanges, and stable alignment   New Medications:  No orders of the defined types were placed in this encounter.     Oliver Barre, MD  01/06/2022 4:05 PM

## 2022-01-07 ENCOUNTER — Other Ambulatory Visit: Payer: Self-pay | Admitting: Orthopedic Surgery

## 2022-01-07 DIAGNOSIS — S62644D Nondisplaced fracture of proximal phalanx of right ring finger, subsequent encounter for fracture with routine healing: Secondary | ICD-10-CM

## 2022-01-07 DIAGNOSIS — F411 Generalized anxiety disorder: Secondary | ICD-10-CM | POA: Diagnosis not present

## 2022-01-07 DIAGNOSIS — R41840 Attention and concentration deficit: Secondary | ICD-10-CM | POA: Diagnosis not present

## 2022-01-07 DIAGNOSIS — S62616D Displaced fracture of proximal phalanx of right little finger, subsequent encounter for fracture with routine healing: Secondary | ICD-10-CM

## 2022-02-04 DIAGNOSIS — R41840 Attention and concentration deficit: Secondary | ICD-10-CM | POA: Diagnosis not present

## 2022-02-04 DIAGNOSIS — F411 Generalized anxiety disorder: Secondary | ICD-10-CM | POA: Diagnosis not present

## 2022-03-11 DIAGNOSIS — F17210 Nicotine dependence, cigarettes, uncomplicated: Secondary | ICD-10-CM | POA: Diagnosis not present

## 2022-03-11 DIAGNOSIS — F411 Generalized anxiety disorder: Secondary | ICD-10-CM | POA: Diagnosis not present

## 2022-03-19 ENCOUNTER — Encounter: Payer: Self-pay | Admitting: Radiology

## 2022-04-29 DIAGNOSIS — O039 Complete or unspecified spontaneous abortion without complication: Secondary | ICD-10-CM | POA: Diagnosis not present

## 2022-04-29 DIAGNOSIS — R41840 Attention and concentration deficit: Secondary | ICD-10-CM | POA: Diagnosis not present

## 2022-05-27 DIAGNOSIS — R41840 Attention and concentration deficit: Secondary | ICD-10-CM | POA: Diagnosis not present

## 2022-06-24 DIAGNOSIS — R41840 Attention and concentration deficit: Secondary | ICD-10-CM | POA: Diagnosis not present

## 2022-06-24 DIAGNOSIS — O039 Complete or unspecified spontaneous abortion without complication: Secondary | ICD-10-CM | POA: Diagnosis not present

## 2022-07-22 DIAGNOSIS — F17210 Nicotine dependence, cigarettes, uncomplicated: Secondary | ICD-10-CM | POA: Diagnosis not present

## 2022-07-22 DIAGNOSIS — F411 Generalized anxiety disorder: Secondary | ICD-10-CM | POA: Diagnosis not present

## 2022-08-19 DIAGNOSIS — O039 Complete or unspecified spontaneous abortion without complication: Secondary | ICD-10-CM | POA: Diagnosis not present

## 2022-08-19 DIAGNOSIS — R41840 Attention and concentration deficit: Secondary | ICD-10-CM | POA: Diagnosis not present

## 2023-11-10 DIAGNOSIS — F17210 Nicotine dependence, cigarettes, uncomplicated: Secondary | ICD-10-CM | POA: Diagnosis not present

## 2023-11-10 DIAGNOSIS — R41840 Attention and concentration deficit: Secondary | ICD-10-CM | POA: Diagnosis not present

## 2023-11-10 DIAGNOSIS — F411 Generalized anxiety disorder: Secondary | ICD-10-CM | POA: Diagnosis not present

## 2023-12-08 DIAGNOSIS — F411 Generalized anxiety disorder: Secondary | ICD-10-CM | POA: Diagnosis not present

## 2023-12-08 DIAGNOSIS — R41840 Attention and concentration deficit: Secondary | ICD-10-CM | POA: Diagnosis not present

## 2023-12-08 DIAGNOSIS — F17210 Nicotine dependence, cigarettes, uncomplicated: Secondary | ICD-10-CM | POA: Diagnosis not present
